# Patient Record
Sex: Female | Born: 1970 | Hispanic: Yes | Smoking: Current every day smoker
Health system: Southern US, Community
[De-identification: ages and names within clinical notes are randomized; demographics above are authoritative.]

## PROBLEM LIST (undated history)

## (undated) DIAGNOSIS — E559 Vitamin D deficiency, unspecified: Secondary | ICD-10-CM

## (undated) DIAGNOSIS — K219 Gastro-esophageal reflux disease without esophagitis: Secondary | ICD-10-CM

## (undated) DIAGNOSIS — M1990 Unspecified osteoarthritis, unspecified site: Secondary | ICD-10-CM

## (undated) DIAGNOSIS — N200 Calculus of kidney: Secondary | ICD-10-CM

## (undated) DIAGNOSIS — M329 Systemic lupus erythematosus, unspecified: Secondary | ICD-10-CM

## (undated) DIAGNOSIS — M549 Dorsalgia, unspecified: Secondary | ICD-10-CM

## (undated) DIAGNOSIS — G4730 Sleep apnea, unspecified: Secondary | ICD-10-CM

## (undated) DIAGNOSIS — Z87442 Personal history of urinary calculi: Secondary | ICD-10-CM

## (undated) DIAGNOSIS — M064 Inflammatory polyarthropathy: Secondary | ICD-10-CM

## (undated) DIAGNOSIS — N809 Endometriosis, unspecified: Secondary | ICD-10-CM

## (undated) DIAGNOSIS — I10 Essential (primary) hypertension: Secondary | ICD-10-CM

## (undated) DIAGNOSIS — L409 Psoriasis, unspecified: Secondary | ICD-10-CM

## (undated) DIAGNOSIS — G4700 Insomnia, unspecified: Secondary | ICD-10-CM

## (undated) HISTORY — PX: BUNIONECTOMY: SHX129

## (undated) HISTORY — PX: TONSILLECTOMY: SUR1361

## (undated) HISTORY — DX: Endometriosis, unspecified: N80.9

## (undated) HISTORY — PX: FOOT SURGERY: SHX648

## (undated) HISTORY — DX: Sleep apnea, unspecified: G47.30

## (undated) HISTORY — DX: Unspecified osteoarthritis, unspecified site: M19.90

## (undated) HISTORY — DX: Essential (primary) hypertension: I10

## (undated) HISTORY — DX: Systemic lupus erythematosus, unspecified: M32.9

## (undated) HISTORY — PX: TUBAL LIGATION: SHX77

---

## 2003-10-11 HISTORY — PX: REDUCTION MAMMAPLASTY: SUR839

## 2013-10-10 HISTORY — PX: REDUCTION MAMMAPLASTY: SUR839

## 2015-03-19 DIAGNOSIS — R7989 Other specified abnormal findings of blood chemistry: Secondary | ICD-10-CM | POA: Insufficient documentation

## 2015-03-19 DIAGNOSIS — E229 Hyperfunction of pituitary gland, unspecified: Secondary | ICD-10-CM

## 2016-02-01 ENCOUNTER — Ambulatory Visit (INDEPENDENT_AMBULATORY_CARE_PROVIDER_SITE_OTHER): Admitting: Urology

## 2016-02-01 ENCOUNTER — Encounter: Payer: Self-pay | Admitting: Urology

## 2016-02-01 VITALS — BP 133/82 | HR 52 | Ht 62.5 in | Wt 183.8 lb

## 2016-02-01 DIAGNOSIS — M549 Dorsalgia, unspecified: Secondary | ICD-10-CM | POA: Diagnosis not present

## 2016-02-01 NOTE — Progress Notes (Signed)
02/01/2016 4:29 PM   Patricia Ray 1971/03/09 [id number]  Referring provider: No referring provider defined for this encounter.  Chief Complaint  Patient presents with  . New Patient (Initial Visit)    lower mid-back pain    HPI: Patient is a 45 year old Caucasian female who presents today with lower mid back pain that is reminiscent of previous stone that she has had in the remote past.  Patient states the back pain began in August 2016. She states the pain was so excruciating that she felt like she had to vomit. The pain is located across the mid back. She did have a stone when she was 7 months pregnant and she states it feels very similar to that incident. She states that she has never passed that stone and that occurred in 1997.  She states the pain lasts about 2-3 minutes. Laying down seems to alleviate the pain.  She states the pain can reach 100 out of 10 on a pain scale and a second.   She is not experiencing urinary symptoms. She specifically denies gross hematuria, flank pain and suprapubic pain. She has not had any recent fevers, chills, nausea or vomiting.  She has not had any recent imaging studies. Her UA on today's exam is negative.     PMH: Past Medical History  Diagnosis Date  . Arthritis   . Hypertension   . Lupus [facility])     Surgical History: Past Surgical History  Procedure Laterality Date  . Cesarean section    . Tubal ligation    . Reduction mammaplasty      Home Medications:    Medication List       This list is accurate as of: 02/01/16  4:29 PM.  Always use your most recent med list.               hydroxychloroquine 200 MG tablet  Commonly known as:  PLAQUENIL  Take by mouth.     hydroxychloroquine 200 MG tablet  Commonly known as:  PLAQUENIL     meloxicam 15 MG tablet  Commonly known as:  MOBIC  TK 1 T PO QD     OTEZLA 10 & 20 & 30 MG Tbpk  Generic drug:  Apremilast  Take by mouth.     OTEZLA 30 MG Tabs  Generic drug:   Apremilast     triamterene-hydrochlorothiazide 37.5-25 MG tablet  Commonly known as:  MAXZIDE-25        Allergies:  Allergies  Allergen Reactions  . Nickel Swelling    Swelling and rash    Family History: Family History  Problem Relation Age of Onset  . Diabetes Mother   . Colon cancer Maternal Grandmother     Social History:  reports that she quit smoking about 9 years ago. She does not have any smokeless tobacco history on file. She reports that she does not drink alcohol or use illicit drugs.  ROS: UROLOGY Frequent Urination?: No Hard to postpone urination?: No Burning/pain with urination?: No Get up at night to urinate?: No Leakage of urine?: No Urine stream starts and stops?: No Trouble starting stream?: No Do you have to strain to urinate?: No Blood in urine?: No Urinary tract infection?: No Sexually transmitted disease?: No Injury to kidneys or bladder?: No Painful intercourse?: No Weak stream?: No Currently pregnant?: No Vaginal bleeding?: No Last menstrual period?: 2 yrs ago  Gastrointestinal Nausea?: Yes Vomiting?: No Indigestion/heartburn?: No Diarrhea?: No Constipation?: Yes  Constitutional Fever: No Night  sweats?: Yes Weight loss?: No Fatigue?: No  Skin Skin rash/lesions?: No Itching?: No  Eyes Blurred vision?: Yes Double vision?: No  Ears/Nose/Throat Sore throat?: No Sinus problems?: No  Hematologic/Lymphatic Swollen glands?: No Easy bruising?: No  Cardiovascular Leg swelling?: No Chest pain?: No  Respiratory Cough?: No Shortness of breath?: No  Endocrine Excessive thirst?: No  Musculoskeletal Back pain?: Yes Joint pain?: Yes  Neurological Headaches?: No Dizziness?: Yes  Psychologic Depression?: No Anxiety?: No  Physical Exam: BP 133/82 mmHg  Pulse 52  Ht 5' 2.5" (1.588 m)  Wt 183 lb 12.8 oz (83.371 kg)  BMI 33.06 kg/m2  LMP 10/02/2014  Constitutional: Well nourished. Alert and oriented, No acute  distress. HEENT: [place] AT, moist mucus membranes. Trachea midline, no masses. Cardiovascular: No clubbing, cyanosis, or edema. Respiratory: Normal respiratory effort, no increased work of breathing. GI: Abdomen is soft, non tender, non distended, no abdominal masses. Liver and spleen not palpable.  No hernias appreciated.  Stool sample for occult testing is not indicated.   GU: No CVA tenderness.  No bladder fullness or masses.   Skin: No rashes, bruises or suspicious lesions. Lymph: No cervical or inguinal adenopathy. Neurologic: Grossly intact, no focal deficits, moving all 4 extremities. Psychiatric: Normal mood and affect.  Laboratory Data:  Urinalysis Results for orders placed or performed in visit on 02/01/16  CULTURE, URINE COMPREHENSIVE  Result Value Ref Range   Urine Culture, Comprehensive Final report    Result 1 Comment   Microscopic Examination  Result Value Ref Range   WBC, UA None seen 0 -  5 /hpf   RBC, UA None seen 0 -  2 /hpf   Epithelial Cells (non renal) 0-10 0 - 10 /hpf   Bacteria, UA None seen None seen/Few  Urinalysis, Complete  Result Value Ref Range   Specific Gravity, UA >1.030 (H) 1.005 - 1.030   pH, UA 6.0 5.0 - 7.5   Color, UA Yellow Yellow   Appearance Ur Clear Clear   Leukocytes, UA Negative Negative   Protein, UA Negative Negative/Trace   Glucose, UA Negative Negative   Ketones, UA Negative Negative   RBC, UA Negative Negative   Bilirubin, UA Negative Negative   Urobilinogen, Ur 0.2 0.2 - 1.0 mg/dL   Nitrite, UA Negative Negative   Microscopic Examination See below:     Assessment & Plan:    1. Back pain, unspecified location:   Patient is experiencing back pain that is reminiscent of her previous stone she had in 1997.  She is unsure if she had passed that stone and is fearful that it is on the move.  Her UA at today's visit was unremarkable.  We will obtain a CT renal stone study for further evaluation and management.  If the pain should  become intractable or if she should experience intractable vomiting, she should seek immediate evaluation in the emergency room. She should also seek immediate evaluation in the emergency room if she should develop fever or chills.    - Urinalysis, Complete - CULTURE, URINE COMPREHENSIVE   Return for CT renal stone report.  These notes generated with voice recognition software. I apologize for typographical errors.  Zara Council, [facility] [address], [place], [place] [address] [phone]

## 2016-02-02 LAB — URINALYSIS, COMPLETE
Bilirubin, UA: NEGATIVE
Glucose, UA: NEGATIVE
Ketones, UA: NEGATIVE
Leukocytes, UA: NEGATIVE
Nitrite, UA: NEGATIVE
Protein, UA: NEGATIVE
RBC, UA: NEGATIVE
Specific Gravity, UA: >1.03 — ABNORMAL HIGH (ref 1.005–1.030)
Urobilinogen, Ur: 0.2 mg/dL (ref 0.2–1.0)
pH, UA: 6 (ref 5.0–7.5)

## 2016-02-02 LAB — MICROSCOPIC EXAMINATION
Bacteria, UA: NONE SEEN
RBC, UA: NONE SEEN /hpf (ref 0–?)
WBC, UA: NONE SEEN /hpf (ref 0–?)

## 2016-02-03 LAB — CULTURE, URINE COMPREHENSIVE

## 2016-02-05 ENCOUNTER — Ambulatory Visit
Admission: RE | Admit: 2016-02-05 | Discharge: 2016-02-05 | Disposition: A | Discharge status: Home / Self Care | Source: Ambulatory Visit | Attending: Urology | Admitting: Urology

## 2016-02-05 DIAGNOSIS — M549 Dorsalgia, unspecified: Secondary | ICD-10-CM | POA: Insufficient documentation

## 2016-02-05 DIAGNOSIS — N200 Calculus of kidney: Secondary | ICD-10-CM | POA: Diagnosis not present

## 2016-02-07 ENCOUNTER — Encounter: Payer: Self-pay | Admitting: Urology

## 2016-02-07 ENCOUNTER — Telehealth: Payer: Self-pay | Admitting: Urology

## 2016-02-07 DIAGNOSIS — M549 Dorsalgia, unspecified: Secondary | ICD-10-CM | POA: Insufficient documentation

## 2016-02-07 NOTE — Telephone Encounter (Signed)
Patient would like her notes sent to Dr. Sheryle Hail and Dr. Orie Rout at [facility] [phone]).

## 2016-02-08 NOTE — Telephone Encounter (Signed)
Done ° ° °Patricia Ray °

## 2016-02-12 NOTE — Telephone Encounter (Signed)
Patient had CT scan and was calling for results.  She want to know if she had to come to her appointment on 5/10 or if we can give her the results over the phone.  Please advise.

## 2016-02-17 ENCOUNTER — Encounter: Payer: Self-pay | Admitting: Urology

## 2016-02-17 ENCOUNTER — Ambulatory Visit (INDEPENDENT_AMBULATORY_CARE_PROVIDER_SITE_OTHER): Admitting: Urology

## 2016-02-17 VITALS — BP 117/83 | HR 108 | Ht 62.5 in | Wt 183.5 lb

## 2016-02-17 DIAGNOSIS — M549 Dorsalgia, unspecified: Secondary | ICD-10-CM | POA: Diagnosis not present

## 2016-02-17 DIAGNOSIS — N200 Calculus of kidney: Secondary | ICD-10-CM

## 2016-02-17 NOTE — Progress Notes (Signed)
11:20 AM   Patricia Ray September 02, 1971 [id number]  Referring provider: Lisabeth Pick, MD [address] [id number] [facility] [place], [place] [address]  Chief Complaint  Patient presents with  . Follow-up    CT results     HPI: Patient is a 45 year old Caucasian female who presents today for her CT renal stone study results.  Background history Patient presented with lower mid back pain that is reminiscent of stone that she has had in the remote past.  Patient stated the back pain began in August 2016. She stated the pain was so excruciating that she felt like she had to vomit. The pain is located across the mid back. She did have a stone when she was 7 months pregnant and she states it feels very similar to that incident. She stated that she has never passed that stone and that occurred in 1997.  She stated the pain lasts about 2-3 minutes. Laying down seems to alleviate the pain.  She stated the pain can reach 100 out of 10 on a pain scale and a second.     She states that her symptoms have not changed since we first saw her 2 weeks ago.  She is not experiencing urinary symptoms. She specifically denies gross hematuria, flank pain and suprapubic pain. She has not had any recent fevers, chills, nausea or vomiting.  Her CT renal stone study noted a 1 mm nonobstructing left renal calculus. No evidence of ureteral calculi, hydronephrosis or other acute findings.  I have personally reviewed the films with the patient.    PMH: Past Medical History  Diagnosis Date  . Arthritis   . Hypertension   . Lupus [facility])     Surgical History: Past Surgical History  Procedure Laterality Date  . Cesarean section    . Tubal ligation    . Reduction mammaplasty      Home Medications:    Medication List       This list is accurate as of: 02/17/16 11:20 AM.  Always use your most recent med list.               hydroxychloroquine 200 MG tablet  Commonly known as:  PLAQUENIL    Take by mouth.     hydroxychloroquine 200 MG tablet  Commonly known as:  PLAQUENIL  Reported on 02/17/2016     Melatonin 1 MG Tabs  Take 1 tablet by mouth as needed.     meloxicam 15 MG tablet  Commonly known as:  MOBIC  TK 1 T PO QD     OTEZLA 10 & 20 & 30 MG Tbpk  Generic drug:  Apremilast  Take by mouth. Reported on 02/17/2016     OTEZLA 30 MG Tabs  Generic drug:  Apremilast     triamterene-hydrochlorothiazide 37.5-25 MG tablet  Commonly known as:  MAXZIDE-25        Allergies:  Allergies  Allergen Reactions  . Nickel Swelling    Swelling and rash    Family History: Family History  Problem Relation Age of Onset  . Diabetes Mother   . Colon cancer Maternal Grandmother     Social History:  reports that she quit smoking about 9 years ago. She does not have any smokeless tobacco history on file. She reports that she does not drink alcohol or use illicit drugs.  ROS: UROLOGY Frequent Urination?: No Hard to postpone urination?: No Burning/pain with urination?: No Get up at night to urinate?: No  Leakage of urine?: No Urine stream starts and stops?: No Trouble starting stream?: No Do you have to strain to urinate?: No Blood in urine?: No Urinary tract infection?: No Sexually transmitted disease?: No Injury to kidneys or bladder?: No Painful intercourse?: No Weak stream?: No Currently pregnant?: No Vaginal bleeding?: No Last menstrual period?: 73yrs   Gastrointestinal Nausea?: No Vomiting?: No Indigestion/heartburn?: No Diarrhea?: No Constipation?: Yes  Constitutional Fever: No Night sweats?: Yes Weight loss?: No Fatigue?: No  Skin Skin rash/lesions?: Yes Itching?: No  Eyes Blurred vision?: Yes Double vision?: No  Ears/Nose/Throat Sore throat?: No Sinus problems?: No  Hematologic/Lymphatic Swollen glands?: No Easy bruising?: No  Cardiovascular Leg swelling?: No Chest pain?: No  Respiratory Cough?: No Shortness of breath?:  No  Endocrine Excessive thirst?: No  Musculoskeletal Back pain?: Yes Joint pain?: Yes  Neurological Headaches?: No Dizziness?: No  Psychologic Depression?: No Anxiety?: No  Physical Exam: BP 117/83 mmHg  Pulse 108  Ht 5' 2.5" (1.588 m)  Wt 183 lb 8 oz (83.235 kg)  BMI 33.01 kg/m2  LMP 10/02/2014  Constitutional: Well nourished. Alert and oriented, No acute distress. HEENT: [place] AT, moist mucus membranes. Trachea midline, no masses. Cardiovascular: No clubbing, cyanosis, or edema. Respiratory: Normal respiratory effort, no increased work of breathing. Skin: No rashes, bruises or suspicious lesions. Lymph: No cervical or inguinal adenopathy. Neurologic: Grossly intact, no focal deficits, moving all 4 extremities. Psychiatric: Normal mood and affect.  Pertinent imaging CLINICAL DATA: Chronic right back and lower quadrant pain for approximately 2 years. Personal history of nephrolithiasis.  EXAM: CT ABDOMEN AND PELVIS WITHOUT CONTRAST  TECHNIQUE: Multidetector CT imaging of the abdomen and pelvis was performed following the standard protocol without IV contrast.  COMPARISON: None.  FINDINGS: Lower chest: No acute findings.  Hepatobiliary: No mass visualized on this un-enhanced exam. Gallbladder is unremarkable.  Pancreas: No mass or inflammatory process identified on this un-enhanced exam.  Spleen: Within normal limits in size.  Adrenals/Urinary Tract: Unremarkable adrenal glands. A 1 mm punctate calculus is seen in the lower pole of the left kidney. No right renal calculi identified. No evidence ureteral calculi or hydronephrosis. No bladder calculi visualized.  Stomach/Bowel: No evidence of obstruction, inflammatory process, or abnormal fluid collections.  Vascular/Lymphatic: No pathologically enlarged lymph nodes. No evidence of abdominal aortic aneurysm. Aortic atherosclerotic plaque noted.  Reproductive: No mass or other significant  abnormality.  Other: None.  Musculoskeletal: No suspicious bone lesions identified.  IMPRESSION: 1 mm nonobstructive left renal calculus. No evidence of ureteral calculi, hydronephrosis, or other acute findings.   Electronically Signed  By: Earle Gell M.D.  On: 02/05/2016 12:15    Assessment & Plan:    1. Renal stone:   Patient was found to have a 1 mm nonobstructive left renal calculus on CT scan.  This is not likely the cause of her mid back pain as it is nonobstructing. It is also located in the lower pole of the kidney.  Because of the renal anatomy, stones in the lower pole have a difficult time leaving the kidney.  It is suggested to have yearly KUB's to monitor the stability of the left renal stone.  2. Back pain:   A GU etiology was not found for the cause of her back pain.  Suggest she follow up with her primary care physician for further evaluation.  Return if symptoms worsen or fail to improve.  These notes generated with voice recognition software. I apologize for typographical errors.  Gazella Anglin, PA-C  [facility] [address], [place], [place] [address] [phone]

## 2016-02-21 ENCOUNTER — Telehealth: Payer: Self-pay | Admitting: Urology

## 2016-02-21 DIAGNOSIS — N200 Calculus of kidney: Secondary | ICD-10-CM | POA: Insufficient documentation

## 2016-02-21 NOTE — Telephone Encounter (Signed)
Patient would like her notes sent to Dr. Sheryle Hail and Dr. Orie Rout at [facility] [phone]).

## 2016-02-23 ENCOUNTER — Telehealth: Payer: Self-pay | Admitting: Urology

## 2016-02-23 NOTE — Telephone Encounter (Signed)
i've already sent this

## 2016-02-23 NOTE — Telephone Encounter (Signed)
The note from

## 2016-02-23 NOTE — Telephone Encounter (Signed)
The note I dictated Sunday night?

## 2016-02-24 NOTE — Telephone Encounter (Signed)
Did you see this? 

## 2016-02-24 NOTE — Telephone Encounter (Signed)
I faxed your last OV today to both doctor's.  Thanks, Sharyn Lull

## 2016-03-02 ENCOUNTER — Telehealth: Payer: Self-pay | Admitting: Urology

## 2016-03-02 NOTE — Telephone Encounter (Signed)
I spoke with Dr. Jory Sims nurse and she has received my office notes and Mrs. Patricia Ray has an upcoming appointment with Dr. Angelene Giovanni in June.  She can keep that appointment or if she feels a need to call and have it moved up, she may do so.

## 2016-07-18 ENCOUNTER — Other Ambulatory Visit: Payer: Self-pay | Admitting: Family Medicine

## 2016-07-18 DIAGNOSIS — Z1231 Encounter for screening mammogram for malignant neoplasm of breast: Secondary | ICD-10-CM

## 2016-08-12 ENCOUNTER — Ambulatory Visit
Admission: RE | Admit: 2016-08-12 | Discharge: 2016-08-12 | Disposition: A | Discharge status: Home / Self Care | Payer: PRIVATE HEALTH INSURANCE | Source: Ambulatory Visit | Attending: Family Medicine | Admitting: Family Medicine

## 2016-08-12 DIAGNOSIS — Z1231 Encounter for screening mammogram for malignant neoplasm of breast: Secondary | ICD-10-CM | POA: Insufficient documentation

## 2016-08-16 ENCOUNTER — Ambulatory Visit
Admission: RE | Admit: 2016-08-16 | Discharge: 2016-08-16 | Disposition: A | Discharge status: Home / Self Care | Payer: Self-pay | Source: Ambulatory Visit | Attending: *Deleted | Admitting: *Deleted

## 2016-08-16 ENCOUNTER — Other Ambulatory Visit: Payer: Self-pay | Admitting: *Deleted

## 2016-08-16 DIAGNOSIS — Z9289 Personal history of other medical treatment: Secondary | ICD-10-CM

## 2016-12-11 ENCOUNTER — Encounter: Payer: Self-pay | Admitting: Emergency Medicine

## 2016-12-11 ENCOUNTER — Emergency Department
Admission: EM | Admit: 2016-12-11 | Discharge: 2016-12-11 | Disposition: A | Discharge status: Home / Self Care | Payer: 59 | Attending: Emergency Medicine | Admitting: Emergency Medicine

## 2016-12-11 DIAGNOSIS — Z79899 Other long term (current) drug therapy: Secondary | ICD-10-CM | POA: Insufficient documentation

## 2016-12-11 DIAGNOSIS — M25561 Pain in right knee: Secondary | ICD-10-CM | POA: Insufficient documentation

## 2016-12-11 DIAGNOSIS — R531 Weakness: Secondary | ICD-10-CM

## 2016-12-11 DIAGNOSIS — Z87891 Personal history of nicotine dependence: Secondary | ICD-10-CM | POA: Diagnosis not present

## 2016-12-11 DIAGNOSIS — I10 Essential (primary) hypertension: Secondary | ICD-10-CM | POA: Insufficient documentation

## 2016-12-11 DIAGNOSIS — M25562 Pain in left knee: Secondary | ICD-10-CM | POA: Diagnosis not present

## 2016-12-11 LAB — BASIC METABOLIC PANEL
Anion gap: 9 (ref 5–15)
BUN: 7 mg/dL (ref 6–20)
CO2: 27 mmol/L (ref 22–32)
Calcium: 9.6 mg/dL (ref 8.9–10.3)
Chloride: 103 mmol/L (ref 101–111)
Creatinine, Ser: 0.58 mg/dL (ref 0.44–1.00)
GFR calc Af Amer: >60 mL/min (ref >60)
GFR calc non Af Amer: >60 mL/min (ref >60)
Glucose, Bld: 101 mg/dL — ABNORMAL HIGH (ref 65–99)
Potassium: 3.4 mmol/L — ABNORMAL LOW (ref 3.5–5.1)
Sodium: 139 mmol/L (ref 135–145)

## 2016-12-11 LAB — URINALYSIS, COMPLETE (UACMP) WITH MICROSCOPIC
Bacteria, UA: NONE SEEN
Bilirubin Urine: NEGATIVE
Glucose, UA: NEGATIVE mg/dL
Hgb urine dipstick: NEGATIVE
Ketones, ur: NEGATIVE mg/dL
Leukocytes, UA: NEGATIVE
Nitrite: NEGATIVE
Protein, ur: NEGATIVE mg/dL
RBC / HPF: NONE SEEN RBC/hpf (ref 0–5)
Specific Gravity, Urine: 1.009 (ref 1.005–1.030)
pH: 6 (ref 5.0–8.0)

## 2016-12-11 LAB — CBC
HCT: 43.6 % (ref 35.0–47.0)
Hemoglobin: 15.4 g/dL (ref 12.0–16.0)
MCH: 29.5 pg (ref 26.0–34.0)
MCHC: 35.2 g/dL (ref 32.0–36.0)
MCV: 83.8 fL (ref 80.0–100.0)
Platelets: 295 10*3/uL (ref 150–440)
RBC: 5.21 MIL/uL — ABNORMAL HIGH (ref 3.80–5.20)
RDW: 14.6 % — ABNORMAL HIGH (ref 11.5–14.5)
WBC: 11.5 10*3/uL — ABNORMAL HIGH (ref 3.6–11.0)

## 2016-12-11 LAB — GLUCOSE, CAPILLARY: Glucose-Capillary: 95 mg/dL (ref 65–99)

## 2016-12-11 MED ORDER — DIAZEPAM 2 MG PO TABS: 2.0000 mg | ORAL_TABLET | Freq: Three times a day (TID) | ORAL | 0 refills | Status: DC | PRN

## 2016-12-11 NOTE — ED Notes (Signed)
ED Provider at bedside. 
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
## 2016-12-11 NOTE — ED Notes (Signed)
Pt alert and oriented X4, active, cooperative, pt in NAD. RR even and unlabored, color WNL.  Pt informed to return if any life threatening symptoms occur.  Pt states that she feels safe to go home.

## 2016-12-11 NOTE — ED Notes (Addendum)
Pt states that she is having a "lupus flare up" for last 2 days. Generalized weakness and bilateral knee pain, burning sensation. PT drove herself here. Able to stand and pivot to stretcher. Pt alert and oriented X4, active, cooperative, pt in NAD. RR even and unlabored, color WNL.   Pt in stretcher awaiting EDP assessment at this time. Denies further needs.

## 2016-12-11 NOTE — ED Notes (Signed)
FIRST NURSE NOTE:  Pt states she is having a Lupus flare up. Weak and dehydrated.
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
## 2017-03-14 ENCOUNTER — Ambulatory Visit (INDEPENDENT_AMBULATORY_CARE_PROVIDER_SITE_OTHER): Payer: 59 | Admitting: Podiatry

## 2017-03-14 ENCOUNTER — Encounter: Payer: Self-pay | Admitting: Podiatry

## 2017-03-14 DIAGNOSIS — B351 Tinea unguium: Secondary | ICD-10-CM

## 2017-03-14 DIAGNOSIS — Z79899 Other long term (current) drug therapy: Secondary | ICD-10-CM

## 2017-03-14 MED ORDER — TERBINAFINE HCL 250 MG PO TABS
250.0000 mg | ORAL_TABLET | Freq: Every day | ORAL | 0 refills | Status: DC
Start: 2017-03-14 — End: 2018-09-03

## 2017-03-15 LAB — HEPATIC FUNCTION PANEL
ALT: 16 IU/L (ref 0–32)
AST: 19 IU/L (ref 0–40)
Albumin: 4.5 g/dL (ref 3.5–5.5)
Alkaline Phosphatase: 58 IU/L (ref 39–117)
Bilirubin Total: 0.6 mg/dL (ref 0.0–1.2)
Bilirubin, Direct: 0.14 mg/dL (ref 0.00–0.40)
Total Protein: 6.7 g/dL (ref 6.0–8.5)

## 2017-03-15 NOTE — Progress Notes (Signed)
   Subjective: Patient presents today for possible treatment and evaluation of fungal nails of bilateral great toes. She reports associated loosening of the nails from the nailbeds. Patient states that the nails have been discolored and thickened for greater than 1 month. Patient presents today for further treatment and evaluation.  Objective: Physical Exam General: The patient is alert and oriented x3 in no acute distress.  Dermatology: Hyperkeratotic, discolored, thickened, onychodystrophy of nails noted bilaterally.  Skin is warm, dry and supple bilateral lower extremities. Negative for open lesions or macerations.  Vascular: Palpable pedal pulses bilaterally. No edema or erythema noted. Capillary refill within normal limits.  Neurological: Epicritic and protective threshold grossly intact bilaterally.   Musculoskeletal Exam: Range of motion within normal limits to all pedal and ankle joints bilateral. Muscle strength 5/5 in all groups bilateral.   Assessment: #1 onychodystrophy bilateral great toenails #2 possible onychomycosis #3 hyperkeratotic great toenails bilateral  Plan of Care:  #1 Patient was evaluated. #2 Orders for liver function tests were ordered today.  #3 Today nail biopsy was taken and sent to pathology for fungal culture. #4 prescription for Lamisil 250 mg #90 given to patient. #5 patient is to return to clinic when necessary.   Edrick Kins, DPM [facility]  Dr. Edrick Kins, [address]                                        [place], [place] [address]                Office [phone]  Fax [phone]

## 2017-03-23 ENCOUNTER — Telehealth: Payer: Self-pay | Admitting: *Deleted

## 2017-03-23 NOTE — Telephone Encounter (Signed)
Pt request blood lab results. I informed pt the results were within normal limits and Dr. Amalia Hailey states pt can begin the Terbinafine, and reappt in 6 months. Pt states understanding and will call for an appt later.

## 2017-10-08 ENCOUNTER — Other Ambulatory Visit: Payer: Self-pay

## 2017-10-08 ENCOUNTER — Encounter: Payer: Self-pay | Admitting: Emergency Medicine

## 2017-10-08 DIAGNOSIS — Z87891 Personal history of nicotine dependence: Secondary | ICD-10-CM | POA: Diagnosis not present

## 2017-10-08 DIAGNOSIS — Z79899 Other long term (current) drug therapy: Secondary | ICD-10-CM | POA: Insufficient documentation

## 2017-10-08 DIAGNOSIS — L509 Urticaria, unspecified: Secondary | ICD-10-CM | POA: Insufficient documentation

## 2017-10-08 DIAGNOSIS — I10 Essential (primary) hypertension: Secondary | ICD-10-CM | POA: Insufficient documentation

## 2017-10-08 NOTE — ED Triage Notes (Signed)
Pt arrived to the ED for complaints of hives x2 days. Pt reports that she has not changed anything on her dayl routine and that benadryl is not helping with the itching. Pt is AOx4 in no apparent distress with notable hives spreading form her back to her legs.

## 2017-10-09 ENCOUNTER — Emergency Department
Admission: EM | Admit: 2017-10-09 | Discharge: 2017-10-09 | Disposition: A | Discharge status: Home / Self Care | Payer: 59 | Attending: Emergency Medicine | Admitting: Emergency Medicine

## 2017-10-09 DIAGNOSIS — L509 Urticaria, unspecified: Secondary | ICD-10-CM

## 2017-10-09 MED ORDER — HYDROXYZINE HCL 25 MG PO TABS
25.0000 mg | ORAL_TABLET | Freq: Once | ORAL | Status: AC
  Administered 2017-10-09: 25 mg via ORAL
  Filled 2017-10-09: qty 1

## 2017-10-09 MED ORDER — PREDNISONE 20 MG PO TABS
60.0000 mg | ORAL_TABLET | Freq: Once | ORAL | Status: AC
  Administered 2017-10-09: 60 mg via ORAL
  Filled 2017-10-09: qty 3

## 2017-10-09 MED ORDER — HYDROXYZINE HCL 25 MG PO TABS: 25.0000 mg | ORAL_TABLET | Freq: Three times a day (TID) | ORAL | 0 refills | Status: DC | PRN

## 2017-10-09 MED ORDER — PREDNISONE 20 MG PO TABS: 60.0000 mg | ORAL_TABLET | Freq: Every day | ORAL | 0 refills | Status: AC

## 2017-10-09 MED ORDER — FAMOTIDINE 20 MG PO TABS
20.0000 mg | ORAL_TABLET | Freq: Once | ORAL | Status: AC
  Administered 2017-10-09: 20 mg via ORAL
  Filled 2017-10-09: qty 1

## 2017-10-09 NOTE — ED Provider Notes (Signed)
[facility] Emergency Department Provider Note   First MD Initiated Contact with Patient 10/09/17 [phone]     (approximate)  I have reviewed the triage vital signs and the nursing notes.   HISTORY  Chief Complaint Urticaria   HPI Patricia Ray is a 46 y.o. female with below list of chronic medical conditions presents to the emergency department with 2-day history of generalized pruritus and hives.  Patient denies any difficulty breathing no difficulty swallowing.  Patient denies any facial swelling.  Patient with no history of allergic reactions in the past unsure of what may have caused her pruritus.  Patient states that she has been taking Benadryl at home without relief last Benadryl dose was at 7:00 PM tonight.   Past Medical History:  Diagnosis Date  . Arthritis   . Hypertension   . Lupus     Patient Active Problem List   Diagnosis Date Noted  . Renal stone 02/21/2016  . Notalgia 02/07/2016  . Increased prolactin level ([place]) 03/19/2015    Past Surgical History:  Procedure Laterality Date  . BREAST CYST ASPIRATION Right 2008  . CESAREAN SECTION    . REDUCTION MAMMAPLASTY  2015  . TUBAL LIGATION      Prior to Admission medications   Medication Sig Start Date End Date Taking? Authorizing Provider  ALPRAZolam Duanne Moron) 0.5 MG tablet Take 0.5 mg by mouth at bedtime as needed for anxiety.    [provider]  Apremilast (OTEZLA) 10 & 20 & 30 MG TBPK Take by mouth. Reported on 02/17/2016    [provider]  hydroxychloroquine (PLAQUENIL) 200 MG tablet Take by mouth. 07/04/14   [provider]  hydroxychloroquine (PLAQUENIL) 200 MG tablet Reported on 02/17/2016 01/04/16   [provider]  Melatonin 1 MG TABS Take 1 tablet by mouth as needed.    [provider]  meloxicam (MOBIC) 15 MG tablet TK 1 T PO QD 01/12/16   [provider]  OTEZLA 30 MG TABS  12/11/15   [provider]  terbinafine  (LAMISIL) 250 MG tablet Take 1 tablet (250 mg total) by mouth daily. 03/14/17   Edrick Kins, DPM  triamterene-hydrochlorothiazide [facility]) 37.5-25 MG tablet  12/27/15   [provider]  Vitamin D, Ergocalciferol, (DRISDOL) 50000 units CAPS capsule Take 50,000 Units by mouth every 7 (seven) days.    [provider]    Allergies Simvastatin; Methotrexate; and Nickel  Family History  Problem Relation Age of Onset  . Diabetes Mother   . Colon cancer Maternal Grandmother     Social History Social History   Tobacco Use  . Smoking status: Former Smoker    Last attempt to quit: 02/01/2007    Years since quitting: 10.6  . Smokeless tobacco: Never Used  Substance Use Topics  . Alcohol use: No    Alcohol/week: 0.0 oz  . Drug use: No    Review of Systems Constitutional: No fever/chills Eyes: No visual changes. ENT: No sore throat. Cardiovascular: Denies chest pain. Respiratory: Denies shortness of breath. Gastrointestinal: No abdominal pain.  No nausea, no vomiting.  No diarrhea.  No constipation. Genitourinary: Negative for dysuria. Musculoskeletal: Negative for neck pain.  Negative for back pain. Integumentary: Positive for pruritus and hives Neurological: Negative for headaches, focal weakness or numbness.  ____________________________________________   PHYSICAL EXAM:  VITAL SIGNS: ED Triage Vitals [10/08/17 2218]  Enc Vitals Group     BP 112/78     Pulse Rate (!) 120  Resp 18     Temp 98.4 F (36.9 C)     Temp Source Oral     SpO2 96 %     Weight 71.7 kg (158 lb)     Height 1.575 m (5\' 2" )     Head Circumference      Peak Flow      Pain Score 0     Pain Loc      Pain Edu?      Excl. in [place]?     Constitutional: Alert and oriented. Well appearing and in no acute distress. Eyes: Conjunctivae are normal.  Mouth/Throat: Mucous membranes are moist.  Oropharynx non-erythematous. Neck: No stridor.   Cardiovascular: Normal rate, regular  rhythm. Good peripheral circulation. Grossly normal heart sounds. Respiratory: Normal respiratory effort.  No retractions. Lungs CTAB. Gastrointestinal: Soft and nontender. No distention.  Musculoskeletal: No lower extremity tenderness nor edema. No gross deformities of extremities. Neurologic:  Normal speech and language. No gross focal neurologic deficits are appreciated.  Skin: Bilateral upper extremity and chest and back excoriations hives noted on the arm Psychiatric: Mood and affect are normal. Speech and behavior are normal.  ____________________________________________     Procedures   ____________________________________________   INITIAL IMPRESSION / ASSESSMENT AND PLAN / ED COURSE  As part of my medical decision making, I reviewed the following data within the electronic MEDICAL RECORD [id number] year old female presented with above-stated history and physical exam consistent with urticaria of unknown etiology.  Patient will be prescribed Atarax and prednisone for home. ____________________________________________  FINAL CLINICAL IMPRESSION(S) / ED DIAGNOSES  Final diagnoses:  Urticaria     MEDICATIONS GIVEN DURING THIS VISIT:  Medications  hydrOXYzine (ATARAX/VISTARIL) tablet 25 mg (25 mg Oral Given 10/09/17 0114)  predniSONE (DELTASONE) tablet 60 mg (60 mg Oral Given 10/09/17 0114)  famotidine (PEPCID) tablet 20 mg (20 mg Oral Given 10/09/17 0114)     ED Discharge Orders    None       Note:  This document was prepared using Dragon voice recognition software and may include unintentional dictation errors.    Gregor Hams, MD 10/09/17 [phone]

## 2018-02-28 ENCOUNTER — Other Ambulatory Visit: Payer: Self-pay | Admitting: Family Medicine

## 2018-02-28 DIAGNOSIS — Z1231 Encounter for screening mammogram for malignant neoplasm of breast: Secondary | ICD-10-CM

## 2018-03-21 ENCOUNTER — Ambulatory Visit
Admission: RE | Admit: 2018-03-21 | Discharge: 2018-03-21 | Disposition: A | Discharge status: Home / Self Care | Payer: 59 | Source: Ambulatory Visit | Attending: Family Medicine | Admitting: Family Medicine

## 2018-03-21 DIAGNOSIS — Z1231 Encounter for screening mammogram for malignant neoplasm of breast: Secondary | ICD-10-CM | POA: Insufficient documentation

## 2018-03-22 ENCOUNTER — Other Ambulatory Visit: Payer: Self-pay | Admitting: Family Medicine

## 2018-03-22 DIAGNOSIS — R928 Other abnormal and inconclusive findings on diagnostic imaging of breast: Secondary | ICD-10-CM

## 2018-03-22 DIAGNOSIS — N6310 Unspecified lump in the right breast, unspecified quadrant: Secondary | ICD-10-CM

## 2018-03-29 ENCOUNTER — Ambulatory Visit
Admission: RE | Admit: 2018-03-29 | Discharge: 2018-03-29 | Disposition: A | Discharge status: Home / Self Care | Payer: 59 | Source: Ambulatory Visit | Attending: Family Medicine | Admitting: Family Medicine

## 2018-03-29 DIAGNOSIS — R928 Other abnormal and inconclusive findings on diagnostic imaging of breast: Secondary | ICD-10-CM

## 2018-03-29 DIAGNOSIS — N6310 Unspecified lump in the right breast, unspecified quadrant: Secondary | ICD-10-CM

## 2018-04-02 ENCOUNTER — Other Ambulatory Visit: Payer: Self-pay | Admitting: Family Medicine

## 2018-04-02 DIAGNOSIS — R928 Other abnormal and inconclusive findings on diagnostic imaging of breast: Secondary | ICD-10-CM

## 2018-04-02 DIAGNOSIS — N6310 Unspecified lump in the right breast, unspecified quadrant: Secondary | ICD-10-CM

## 2018-04-04 ENCOUNTER — Ambulatory Visit
Admission: RE | Admit: 2018-04-04 | Discharge: 2018-04-04 | Disposition: A | Discharge status: Home / Self Care | Payer: 59 | Source: Ambulatory Visit | Attending: Family Medicine | Admitting: Family Medicine

## 2018-04-04 DIAGNOSIS — R928 Other abnormal and inconclusive findings on diagnostic imaging of breast: Secondary | ICD-10-CM | POA: Diagnosis present

## 2018-04-04 DIAGNOSIS — N6310 Unspecified lump in the right breast, unspecified quadrant: Secondary | ICD-10-CM

## 2018-04-04 HISTORY — PX: BREAST BIOPSY: SHX20

## 2018-04-05 LAB — SURGICAL PATHOLOGY

## 2018-04-11 DIAGNOSIS — M064 Inflammatory polyarthropathy: Secondary | ICD-10-CM | POA: Insufficient documentation

## 2018-04-11 DIAGNOSIS — L930 Discoid lupus erythematosus: Secondary | ICD-10-CM | POA: Insufficient documentation

## 2018-05-08 NOTE — Progress Notes (Signed)
Office Visit Note  Patient: Patricia Ray             Date of Birth: Dec 28, 1970           MRN: [id number]             PCP: Sharyne Peach, MD Referring: Sharyne Peach, MD Visit Date: 05/10/2018 Occupation: Underwriter specialist  Subjective:  Medication management   History of Present Illness: Patricia Ray is a 47 y.o. female seen in consultation per request of her PCP.  According to patient her symptoms are started in 1998 with rash on her face.  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  She was initially tried on topical agents and then Plaquenil was added a year later.  She has been tolerating Plaquenil well and has been getting yearly eye examinations.  She states in 1999 she developed increased joint pain which was in her hips and her knee joints.  She dealt with the pain and no diagnosis was established.  In 2014 she developed psoriasis in her scalp and was diagnosed with psoriatic arthritis.  She states she was placed on methotrexate but had severe reaction between methotrexate and simvastatin she was hospitalized.  After that she was placed on Otezla and psoriasis resolved on Otezla.  She continues to have some joint pain which she describes in her elbows, in her bilateral hands, bilateral hip joints and knee joints.  She is noticed occasional swelling in her hands.  Her right hand gets numb at times.  Patient reports that she has intermittent flares of arthritis during that time she has a lot of discomfort.  She is requesting to get FMLA papers filled.  Activities of Daily Living:  Patient reports morning stiffness for 5 hours.   Patient Denies nocturnal pain.  Difficulty dressing/grooming: Denies Difficulty climbing stairs: Reports Difficulty getting out of chair: Reports Difficulty using hands for taps, buttons, cutlery, and/or writing: Reports  Review of Systems  Constitutional: Positive for fatigue. Negative for activity  change, night sweats, weight gain and weight loss.  HENT: Negative for mouth sores, trouble swallowing, trouble swallowing, mouth dryness and nose dryness.   Eyes: Positive for dryness. Negative for pain, redness, itching and visual disturbance.  Respiratory: Negative for cough, shortness of breath and difficulty breathing.   Cardiovascular: Negative for chest pain, palpitations, hypertension, irregular heartbeat and swelling in legs/feet.  Gastrointestinal: Positive for diarrhea and heartburn. Negative for blood in stool and constipation.  Endocrine: Negative for heat intolerance, excessive thirst and increased urination.  Genitourinary: Negative for difficulty urinating and vaginal dryness.  Musculoskeletal: Positive for arthralgias, joint pain, joint swelling and morning stiffness. Negative for myalgias, muscle weakness, muscle tenderness and myalgias.  Skin: Positive for rash. Negative for color change, hair loss, skin tightness, ulcers and sensitivity to sunlight.  Allergic/Immunologic: Negative for susceptible to infections.  Neurological: Positive for numbness. Negative for dizziness, light-headedness, memory loss, night sweats and weakness.  Hematological: Negative for bruising/bleeding tendency and swollen glands.  Psychiatric/Behavioral: Positive for sleep disturbance. Negative for depressed mood. The patient is not nervous/anxious.     PMFS History:  Patient Active Problem List   Diagnosis Date Noted  . Psoriatic arthritis ([place]) 05/10/2018  . Psoriasis of scalp 05/10/2018  . 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 High risk medication use 05/10/2018  . Essential hypertension 05/10/2018  . Other insomnia 05/10/2018  . Elevated prolactin level ([place]) 05/10/2018  . Vitamin D insufficiency 05/10/2018  .  Renal stone 02/21/2016  . Notalgia 02/07/2016  . Increased prolactin level ([place]) 03/19/2015    Past Medical History:  Diagnosis Date  . Arthritis   . Endometriosis   . Hypertension   .  Lupus ([place])     Family History  Problem Relation Age of Onset  . Hypercalcemia Mother   . High Cholesterol Mother   . Colon cancer Maternal Grandmother   . Diabetes Father   . Bipolar disorder Sister   . Depression Sister   . Breast cancer Neg Hx    Past Surgical History:  Procedure Laterality Date  . BREAST BIOPSY Right 04/04/2018   pending path  . BUNIONECTOMY    . CESAREAN SECTION    . FOOT SURGERY    . REDUCTION MAMMAPLASTY  2015  . TONSILLECTOMY    . TUBAL LIGATION     Social History   Social History Narrative  . Not on file    Objective: Vital Signs: BP 121/80 (BP Location: Right Arm, Patient Position: Sitting, Cuff Size: Normal)   Pulse (!) 101   Resp 16   Ht 5' 2.5" (1.588 m)   Wt 179 lb (81.2 kg)   LMP 10/02/2014   BMI 32.22 kg/m    Physical Exam  Constitutional: She is oriented to person, place, and time. She appears well-developed and well-nourished.  HENT:  Head: Normocephalic and atraumatic.  Eyes: Conjunctivae and EOM are normal.  Neck: Normal range of motion.  Cardiovascular: Normal rate, regular rhythm, normal heart sounds and intact distal pulses.  Pulmonary/Chest: Effort normal and breath sounds normal.  Abdominal: Soft. Bowel sounds are normal.  Lymphadenopathy:    She has no cervical adenopathy.  Neurological: She is alert and oriented to person, place, and time.  Skin: Skin is warm and dry. Capillary refill takes less than 2 seconds.  Psychiatric: She has a normal mood and affect. Her behavior is normal.  Nursing note and vitals reviewed.    Musculoskeletal Exam: Spine thoracic lumbar spine good range of motion.  Shoulder joints elbow joints wrist joint MCPs PIPs DIPs with good range of motion.  Hip joints knee joints ankles MTPs PIPs DIPs were in good range of motion with no synovitis.  CDAI Exam: CDAI Homunculus Exam:   Joint Counts:  CDAI Tender Joint count: 0 CDAI Swollen Joint count: 0  Global Assessments:  Patient Global  Assessment: 5 Provider Global Assessment: 1  CDAI Calculated Score: 6   Investigation: No additional findings.  Imaging: Xr Hip Unilat W Or W/o Pelvis 2-3 Views Right  Result Date: 05/10/2018 No hip joint narrowing or chondrocalcinosis was noted.   SI joint was normal.  Xr Foot 2 Views Left  Result Date: 05/10/2018 Minimal first MTP narrowing PIP DIP narrowing was noted.  No erosive changes were noted.  No intertarsal joint space narrowing was noted.  Calcaneal spur was noted. Impression: These findings are consistent with osteoarthritis of the foot.  Xr Foot 2 Views Right  Result Date: 05/10/2018 Minimal first MTP narrowing PIP DIP narrowing was noted.  No erosive changes were noted.  No intertarsal joint space narrowing was noted.  Calcaneal spur was noted. Impression: These findings are consistent with osteoarthritis of the foot.  Xr Hand 2 View Left  Result Date: 05/10/2018 No MCP PIP or DIP narrowing was noted.  No intercarpal radiocarpal joint space narrowing was noted.  No erosive changes were noted.  No juxta articular osteopenia was noted. Impression: Unremarkable x-ray of the hand.  Xr Hand 2  View Right  Result Date: 05/10/2018 No MCP PIP or DIP narrowing was noted.  No intercarpal radiocarpal joint space narrowing was noted.  No erosive changes were noted.  No juxta articular osteopenia was noted. Impression: Unremarkable x-ray of the hand.  Xr Knee 3 View Left  Result Date: 05/10/2018 Mild medial compartment narrowing was noted.  Moderate patellofemoral narrowing was noted.  No chondrocalcinosis was noted. 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  Xr Knee 3 View Right  Result Date: 05/10/2018 Mild medial compartment narrowing was noted.  Moderate patellofemoral narrowing was noted.  No chondrocalcinosis was noted. 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   Recent Labs: Lab Results    Component Value Date   WBC 11.5 (H) 12/11/2016   HGB 15.4 12/11/2016   PLT 295 12/11/2016   NA 139 12/11/2016   K 3.4 (L) 12/11/2016   CL 103 12/11/2016   CO2 27 12/11/2016   GLUCOSE 101 (H) 12/11/2016   BUN 7 12/11/2016   CREATININE 0.58 12/11/2016   BILITOT 0.6 03/14/2017   ALKPHOS 58 03/14/2017   AST 19 03/14/2017   ALT 16 03/14/2017   PROT 6.7 03/14/2017   ALBUMIN 4.5 03/14/2017   CALCIUM 9.6 12/11/2016   GFRAA >60 12/11/2016    Speciality Comments: No specialty comments available.  Procedures:  No procedures performed Allergies: Simvastatin; Methotrexate; and Nickel   Assessment / Plan:     Visit Diagnoses: Psoriatic arthritis ([place]) -she gives long-standing history of psoriatic arthritis.  She was under care of her rheumatologist in [place].  I do not see any synovitis on examination.  No synovial thickening was noted.  Plan: Sedimentation rate  Psoriasis of scalp - Clobetasol 0.05% shampoo -she has no active psoriasis today.  Discoid lupus - ANA 1:160, dsDNA-, RNP +(labs from hx, no recent autoimmune labs found) patient reports that one time she was diagnosed with systemic lupus.  I will obtain following labs.  Plan: ANA, Anti-scleroderma antibody, RNP Antibody, Anti-Smith antibody, Sjogrens syndrome-A extractable nuclear antibody, Sjogrens syndrome-B extractable nuclear antibody, Anti-DNA antibody, double-stranded, C3 and C4, Beta-2 glycoprotein antibodies, Cardiolipin antibodies, IgG, IgM, IgA, Lupus Anticoagulant Eval w/Reflex  High risk medication use - PLQ 200 mg BID and Otezla 30 mg BIDallergy to MTX (hospitalized for 1 wk after taking MTX and simvastatin) - Plan: CBC with Differential/Platelet, COMPLETE METABOLIC PANEL WITH GFR, Urinalysis, Routine w reflex microscopic, Glucose 6 phosphate dehydrogenase, Hepatitis B core antibody, IgM, Hepatitis B surface antigen, Hepatitis C antibody, Serum protein electrophoresis with reflex, QuantiFERON-TB Gold Plus, IgG, IgA,  IgM  Patient was counseled on the purpose, proper use, and adverse effects of hydroxychloroquine including nausea/diarrhea, skin rash, headaches, and sun sensitivity.  Discussed importance of annual eye exams while on hydroxychloroquine to monitor to ocular toxicity and discussed importance of frequent laboratory monitoring.  Provided patient with eye exam form for baseline ophthalmologic exam.  Provided patient with educational materials on hydroxychloroquine and answered all questions.  Patient consented to hydroxychloroquine.  Will upload consent in the media tab.    Counseled patient that [place] N[place] is a PDE 4 inhibitor that works to treat psoriasis and the joint pain and tenderness of psoriatic arthritis.  Counseled patient on purpose, proper use, and adverse effects of Otezla.  Reviewed the most common adverse effects of weight loss, depression, nausea/diarrhea/vomiting, headaches, and nasal congestion.  Provided patient with medication education material and answered all questions.  Patient consented to [place].  Pain in both hands -no synovitis was noted.  Patient gives history of intermittent swelling in her hands.  Plan: XR Hand 2 View Right, XR Hand 2 View Left, x-ray of bilateral hands were unremarkable.  Rheumatoid factor, Cyclic citrul peptide antibody, IgG  Pain in right hip -painful range of motion of her right hip joint.  Plan: XR HIP UNILAT W OR W/O PELVIS 2-3 VIEWS RIGHT.  The x-ray of hip joint was within normal limits.  Chronic pain of both knees -she complains of chronic pain in her bilateral knee joints with intermittent swelling.  No synovitis was noted today.  Plan: XR KNEE 3 VIEW RIGHT, XR KNEE 3 VIEW LEFT.  The x-ray shows bilateral mild osteoarthritis and moderate chondromalacia patella.  Pain in both feet -she complains of pain in her bilateral feet with intermittent flares.  No synovitis was noted today.  Plan: XR Foot 2 Views Right, XR Foot 2 Views Left which showed mild  osteoarthritic changes.  Essential hypertension  Other insomnia  Elevated prolactin level ([facility])  Vitamin D insufficiency  Renal stone  Other fatigue - Plan: CK, TSH   Orders: Orders Placed This Encounter  Procedures  . XR Hand 2 View Right  . XR Hand 2 View Left  . XR KNEE 3 VIEW RIGHT  . XR KNEE 3 VIEW LEFT  . XR Foot 2 Views Right  . XR Foot 2 Views Left  . XR HIP UNILAT W OR W/O PELVIS 2-3 VIEWS RIGHT  . CBC with Differential/Platelet  . COMPLETE METABOLIC PANEL WITH GFR  . Urinalysis, Routine w reflex microscopic  . CK  . TSH  . Sedimentation rate  . Rheumatoid factor  . Cyclic citrul peptide antibody, IgG  . ANA  . Anti-scleroderma antibody  . RNP Antibody  . Anti-Smith antibody  . Sjogrens syndrome-A extractable nuclear antibody  . Sjogrens syndrome-B extractable nuclear antibody  . Anti-DNA antibody, double-stranded  . C3 and C4  . Beta-2 glycoprotein antibodies  . Cardiolipin antibodies, IgG, IgM, IgA  . Lupus Anticoagulant Eval w/Reflex  . Glucose 6 phosphate dehydrogenase  . Hepatitis B core antibody, IgM  . Hepatitis B surface antigen  . Hepatitis C antibody  . Serum protein electrophoresis with reflex  . QuantiFERON-TB Gold Plus  . IgG, IgA, IgM   No orders of the defined types were placed in this encounter.   Face-to-face time spent with patient was 50 minutes. Greater than 50% of time was spent in counseling and coordination of care.  Follow-Up Instructions: Return in about 3 months (around 08/10/2018) for Psoriatic arthritis,DLE.   Bo Merino, MD  Note - This record has been created using Editor, commissioning.  Chart creation errors have been sought, but may not always  have been located. Such creation errors do not reflect on  the standard of medical care.

## 2018-05-10 ENCOUNTER — Ambulatory Visit (INDEPENDENT_AMBULATORY_CARE_PROVIDER_SITE_OTHER): Payer: Self-pay

## 2018-05-10 ENCOUNTER — Ambulatory Visit (INDEPENDENT_AMBULATORY_CARE_PROVIDER_SITE_OTHER): Payer: 59 | Admitting: Rheumatology

## 2018-05-10 ENCOUNTER — Encounter: Payer: Self-pay | Admitting: Rheumatology

## 2018-05-10 ENCOUNTER — Ambulatory Visit (INDEPENDENT_AMBULATORY_CARE_PROVIDER_SITE_OTHER): Payer: 59

## 2018-05-10 ENCOUNTER — Encounter (INDEPENDENT_AMBULATORY_CARE_PROVIDER_SITE_OTHER): Payer: Self-pay

## 2018-05-10 VITALS — BP 121/80 | HR 101 | Resp 16 | Ht 62.5 in | Wt 179.0 lb

## 2018-05-10 DIAGNOSIS — M25562 Pain in left knee: Secondary | ICD-10-CM

## 2018-05-10 DIAGNOSIS — M25551 Pain in right hip: Secondary | ICD-10-CM

## 2018-05-10 DIAGNOSIS — M79671 Pain in right foot: Secondary | ICD-10-CM | POA: Diagnosis not present

## 2018-05-10 DIAGNOSIS — L409 Psoriasis, unspecified: Secondary | ICD-10-CM | POA: Diagnosis not present

## 2018-05-10 DIAGNOSIS — M25561 Pain in right knee: Secondary | ICD-10-CM | POA: Diagnosis not present

## 2018-05-10 DIAGNOSIS — N200 Calculus of kidney: Secondary | ICD-10-CM

## 2018-05-10 DIAGNOSIS — M79672 Pain in left foot: Secondary | ICD-10-CM

## 2018-05-10 DIAGNOSIS — R5383 Other fatigue: Secondary | ICD-10-CM

## 2018-05-10 DIAGNOSIS — E559 Vitamin D deficiency, unspecified: Secondary | ICD-10-CM | POA: Insufficient documentation

## 2018-05-10 DIAGNOSIS — G8929 Other chronic pain: Secondary | ICD-10-CM

## 2018-05-10 DIAGNOSIS — E229 Hyperfunction of pituitary gland, unspecified: Secondary | ICD-10-CM

## 2018-05-10 DIAGNOSIS — M79641 Pain in right hand: Secondary | ICD-10-CM | POA: Diagnosis not present

## 2018-05-10 DIAGNOSIS — M79642 Pain in left hand: Secondary | ICD-10-CM

## 2018-05-10 DIAGNOSIS — Z79899 Other long term (current) drug therapy: Secondary | ICD-10-CM | POA: Diagnosis not present

## 2018-05-10 DIAGNOSIS — L4050 Arthropathic psoriasis, unspecified: Secondary | ICD-10-CM | POA: Insufficient documentation

## 2018-05-10 DIAGNOSIS — L930 Discoid lupus erythematosus: Secondary | ICD-10-CM

## 2018-05-10 DIAGNOSIS — G4709 Other insomnia: Secondary | ICD-10-CM | POA: Insufficient documentation

## 2018-05-10 DIAGNOSIS — I10 Essential (primary) hypertension: Secondary | ICD-10-CM | POA: Insufficient documentation

## 2018-05-10 DIAGNOSIS — R7989 Other specified abnormal findings of blood chemistry: Secondary | ICD-10-CM | POA: Insufficient documentation

## 2018-05-10 NOTE — Patient Instructions (Signed)
Apremilast oral tablets What is this medicine? APREMILAST (a PRE mil ast) is used to treat plaque psoriasis and psoriatic arthritis. This medicine may be used for other purposes; ask your health care provider or pharmacist if you have questions. COMMON BRAND NAME(S): R[place] Nail What should I tell my health care provider before I take this medicine? They need to know if you have any of these conditions: -dehydration -kidney disease -mental illness -an unusual or allergic reaction to apremilast, other medicines, foods, dyes, or preservatives -pregnant or trying to get pregnant -breast-feeding How should I use this medicine? Take this medicine by mouth with a glass of water. Follow the directions on the prescription label. Do not cut, crush or chew this medicine. You can take it with or without food. If it upsets your stomach, take it with food. Take your medicine at regular intervals. Do not take it more often than directed. Do not stop taking except on your doctor's advice. Talk to your pediatrician regarding the use of this medicine in children. Special care may be needed. Overdosage: If you think you have taken too much of this medicine contact a poison control center or emergency room at once. NOTE: This medicine is only for you. Do not share this medicine with others. What if I miss a dose? If you miss a dose, take it as soon as you can. If it is almost time for your next dose, take only that dose. Do not take double or extra doses. What may interact with this medicine? This medicine may interact with the following medications: -certain medicines for seizures like carbamazepine, phenobarbital, phenytoin -rifampin This list may not describe all possible interactions. Give your health care provider a list of all the medicines, herbs, non-prescription drugs, or dietary supplements you use. Also tell them if you smoke, drink alcohol, or use illegal drugs. Some items may interact with your  medicine. What should I watch for while using this medicine? Tell your doctor or healthcare professional if your symptoms do not start to get better or if they get worse. Patients and their families should watch out for new or worsening depression or thoughts of suicide. Also watch out for sudden changes in feelings such as feeling anxious, agitated, panicky, irritable, hostile, aggressive, impulsive, severely restless, overly excited and hyperactive, or not being able to sleep. If this happens, call your health care professional. Check with your doctor or health care professional if you get an attack of severe diarrhea, nausea and vomiting, or if you sweat a lot. The loss of too much body fluid can make it dangerous for you to take this medicine. What side effects may I notice from receiving this medicine? Side effects that you should report to your doctor or health care professional as soon as possible: -depressed mood -weight loss Side effects that usually do not require medical attention (report to your doctor or health care professional if they continue or are bothersome): -diarrhea -headache -nausea, vomiting This list may not describe all possible side effects. Call your doctor for medical advice about side effects. You may report side effects to FDA at 1-800-FDA-1088. Where should I keep my medicine? Keep out of the reach of children. Store below 30 degrees C (86 degrees F). Throw away any unused medicine after the expiration date. NOTE: This sheet is a summary. It may not cover all possible information. If you have questions about this medicine, talk to your doctor, pharmacist, or health care provider.  2018 Elsevier/Gold Standard (2016-04-13 10:55:44)  Hydroxychloroquine tablets What is this medicine? HYDROXYCHLOROQUINE (hye drox ee KLOR oh kwin) is used to treat rheumatoid arthritis and systemic lupus erythematosus. It is also used to treat malaria. This medicine may be used for  other purposes; ask your health care provider or pharmacist if you have questions. COMMON BRAND NAME(S): Plaquenil, Quineprox What should I tell my health care provider before I take this medicine? They need to know if you have any of these conditions: -diabetes -eye disease, vision problems -G6PD deficiency -history of blood diseases -history of irregular heartbeat -if you often drink alcohol -kidney disease -liver disease -porphyria -psoriasis -seizures -an unusual or allergic reaction to chloroquine, hydroxychloroquine, other medicines, foods, dyes, or preservatives -pregnant or trying to get pregnant -breast-feeding How should I use this medicine? Take this medicine by mouth with a glass of water. Follow the directions on the prescription label. Avoid taking antacids within 4 hours of taking this medicine. It is best to separate these medicines by at least 4 hours. Do not cut, crush or chew this medicine. You can take it with or without food. If it upsets your stomach, take it with food. Take your medicine at regular intervals. Do not take your medicine more often than directed. Take all of your medicine as directed even if you think you are better. Do not skip doses or stop your medicine early. Talk to your pediatrician regarding the use of this medicine in children. While this drug may be prescribed for selected conditions, precautions do apply. Overdosage: If you think you have taken too much of this medicine contact a poison control center or emergency room at once. NOTE: This medicine is only for you. Do not share this medicine with others. What if I miss a dose? If you miss a dose, take it as soon as you can. If it is almost time for your next dose, take only that dose. Do not take double or extra doses. What may interact with this medicine? Do not take this medicine with any of the following medications: -cisapride -dofetilide -dronedarone -live virus  vaccines -penicillamine -pimozide -thioridazine -ziprasidone This medicine may also interact with the following medications: -ampicillin -antacids -cimetidine -cyclosporine -digoxin -medicines for diabetes, like insulin, glipizide, glyburide -medicines for seizures like carbamazepine, phenobarbital, phenytoin -mefloquine -methotrexate -other medicines that prolong the QT interval (cause an abnormal heart rhythm) -praziquantel This list may not describe all possible interactions. Give your health care provider a list of all the medicines, herbs, non-prescription drugs, or dietary supplements you use. Also tell them if you smoke, drink alcohol, or use illegal drugs. Some items may interact with your medicine. What should I watch for while using this medicine? Tell your doctor or healthcare professional if your symptoms do not start to get better or if they get worse. Avoid taking antacids within 4 hours of taking this medicine. It is best to separate these medicines by at least 4 hours. Tell your doctor or health care professional right away if you have any change in your eyesight. Your vision and blood may be tested before and during use of this medicine. This medicine can make you more sensitive to the sun. Keep out of the sun. If you cannot avoid being in the sun, wear protective clothing and use sunscreen. Do not use sun lamps or tanning beds/booths. What side effects may I notice from receiving this medicine? Side effects that you should report to your doctor or health care professional as soon as possible: -allergic reactions like skin  rash, itching or hives, swelling of the face, lips, or tongue -changes in vision -decreased hearing or ringing of the ears -redness, blistering, peeling or loosening of the skin, including inside the mouth -seizures -sensitivity to light -signs and symptoms of a dangerous change in heartbeat or heart rhythm like chest pain; dizziness; fast or  irregular heartbeat; palpitations; feeling faint or lightheaded, falls; breathing problems -signs and symptoms of liver injury like dark yellow or brown urine; general ill feeling or flu-like symptoms; light-colored stools; loss of appetite; nausea; right upper belly pain; unusually weak or tired; yellowing of the eyes or skin -signs and symptoms of low blood sugar such as feeling anxious; confusion; dizziness; increased hunger; unusually weak or tired; sweating; shakiness; cold; irritable; headache; blurred vision; fast heartbeat; loss of consciousness -uncontrollable head, mouth, neck, arm, or leg movements Side effects that usually do not require medical attention (report to your doctor or health care professional if they continue or are bothersome): -anxious -diarrhea -dizziness -hair loss -headache -irritable -loss of appetite -nausea, vomiting -stomach pain This list may not describe all possible side effects. Call your doctor for medical advice about side effects. You may report side effects to FDA at 1-800-FDA-1088. Where should I keep my medicine? Keep out of the reach of children. In children, this medicine can cause overdose with small doses. Store at room temperature between 15 and 30 degrees C (59 and 86 degrees F). Protect from moisture and light. Throw away any unused medicine after the expiration date. NOTE: This sheet is a summary. It may not cover all possible information. If you have questions about this medicine, talk to your doctor, pharmacist, or health care provider.  2018 Elsevier/Gold Standard (2016-05-11 14:16:15)

## 2018-05-14 NOTE — Progress Notes (Signed)
Will discuss at the fu visit

## 2018-05-15 LAB — URINALYSIS, ROUTINE W REFLEX MICROSCOPIC
Bilirubin Urine: NEGATIVE
Glucose, UA: NEGATIVE
Hgb urine dipstick: NEGATIVE
Ketones, ur: NEGATIVE
Leukocytes, UA: NEGATIVE
Nitrite: NEGATIVE
Specific Gravity, Urine: 1.03 (ref 1.001–1.03)
pH: 5.5 (ref 5.0–8.0)

## 2018-05-15 LAB — SJOGRENS SYNDROME-A EXTRACTABLE NUCLEAR ANTIBODY: SSA (Ro) (ENA) Antibody, IgG: >8 AI — AB

## 2018-05-15 LAB — CBC WITH DIFFERENTIAL/PLATELET
Basophils Absolute: 47 cells/uL (ref 0–200)
Basophils Relative: 0.5 %
Eosinophils Absolute: 1278 cells/uL — ABNORMAL HIGH (ref 15–500)
Eosinophils Relative: 13.6 %
HCT: 48.8 % — ABNORMAL HIGH (ref 35.0–45.0)
Hemoglobin: 16.3 g/dL — ABNORMAL HIGH (ref 11.7–15.5)
Lymphs Abs: 2867 cells/uL (ref 850–3900)
MCH: 28.8 pg (ref 27.0–33.0)
MCHC: 33.4 g/dL (ref 32.0–36.0)
MCV: 86.4 fL (ref 80.0–100.0)
MPV: 9.4 fL (ref 7.5–12.5)
Monocytes Relative: 7.4 %
Neutro Abs: 4512 cells/uL (ref 1500–7800)
Neutrophils Relative %: 48 %
Platelets: 292 10*3/uL (ref 140–400)
RBC: 5.65 10*6/uL — ABNORMAL HIGH (ref 3.80–5.10)
RDW: 14.1 % (ref 11.0–15.0)
Total Lymphocyte: 30.5 %
WBC mixed population: 696 cells/uL (ref 200–950)
WBC: 9.4 10*3/uL (ref 3.8–10.8)

## 2018-05-15 LAB — CYCLIC CITRUL PEPTIDE ANTIBODY, IGG: Cyclic Citrullin Peptide Ab: <16 UNITS

## 2018-05-15 LAB — PROTEIN ELECTROPHORESIS, SERUM, WITH REFLEX
Albumin ELP: 4.5 g/dL (ref 3.8–4.8)
Alpha 1: 0.3 g/dL (ref 0.2–0.3)
Alpha 2: 0.9 g/dL (ref 0.5–0.9)
Beta 2: 0.5 g/dL (ref 0.2–0.5)
Beta Globulin: 0.5 g/dL (ref 0.4–0.6)
Gamma Globulin: 0.9 g/dL (ref 0.8–1.7)
Total Protein: 7.7 g/dL (ref 6.1–8.1)

## 2018-05-15 LAB — RHEUMATOID FACTOR: Rhuematoid fact SerPl-aCnc: <14 IU/mL (ref <14)

## 2018-05-15 LAB — CARDIOLIPIN ANTIBODIES, IGG, IGM, IGA
Anticardiolipin IgA: <11 [APL'U]
Anticardiolipin IgG: <14 [GPL'U]
Anticardiolipin IgM: <12 [MPL'U]

## 2018-05-15 LAB — C3 AND C4
C3 Complement: 196 mg/dL — ABNORMAL HIGH (ref 83–193)
C4 Complement: 50 mg/dL (ref 15–57)

## 2018-05-15 LAB — ANTI-SMITH ANTIBODY: ENA SM Ab Ser-aCnc: <1 AI

## 2018-05-15 LAB — ANTI-NUCLEAR AB-TITER (ANA TITER): ANA Titer 1: 1:160 {titer} — ABNORMAL HIGH

## 2018-05-15 LAB — QUANTIFERON-TB GOLD PLUS
Mitogen-NIL: >10 IU/mL
NIL: 0.05 IU/mL
QuantiFERON-TB Gold Plus: NEGATIVE
TB1-NIL: 0.05 IU/mL
TB2-NIL: 0.02 IU/mL

## 2018-05-15 LAB — MICROSCOPIC MESSAGE

## 2018-05-15 LAB — COMPLETE METABOLIC PANEL WITH GFR
AG Ratio: 1.7 (calc) (ref 1.0–2.5)
ALT: 25 U/L (ref 6–29)
AST: 19 U/L (ref 10–35)
Albumin: 4.8 g/dL (ref 3.6–5.1)
Alkaline phosphatase (APISO): 64 U/L (ref 33–115)
BUN: 15 mg/dL (ref 7–25)
CO2: 23 mmol/L (ref 20–32)
Calcium: 10.2 mg/dL (ref 8.6–10.2)
Chloride: 102 mmol/L (ref 98–110)
Creat: 0.76 mg/dL (ref 0.50–1.10)
GFR, Est African American: 108 mL/min/{1.73_m2} (ref >=60)
GFR, Est Non African American: 93 mL/min/{1.73_m2} (ref >=60)
Globulin: 2.9 g/dL (calc) (ref 1.9–3.7)
Glucose, Bld: 76 mg/dL (ref 65–99)
Potassium: 3.8 mmol/L (ref 3.5–5.3)
Sodium: 141 mmol/L (ref 135–146)
Total Bilirubin: 0.5 mg/dL (ref 0.2–1.2)
Total Protein: 7.7 g/dL (ref 6.1–8.1)

## 2018-05-15 LAB — HEPATITIS B CORE ANTIBODY, IGM: Hep B C IgM: NONREACTIVE

## 2018-05-15 LAB — SJOGRENS SYNDROME-B EXTRACTABLE NUCLEAR ANTIBODY: SSB (La) (ENA) Antibody, IgG: <1 AI

## 2018-05-15 LAB — ANTI-DNA ANTIBODY, DOUBLE-STRANDED: ds DNA Ab: <1 IU/mL

## 2018-05-15 LAB — LUPUS ANTICOAGULANT EVAL W/ REFLEX
PTT-LA Screen: 35 s (ref <=40)
dRVVT: 43 s (ref <=45)

## 2018-05-15 LAB — HEPATITIS B SURFACE ANTIGEN: Hepatitis B Surface Ag: NONREACTIVE

## 2018-05-15 LAB — BETA-2 GLYCOPROTEIN ANTIBODIES
Beta-2 Glyco 1 IgA: <9 SAU (ref <=20)
Beta-2 Glyco 1 IgM: <9 SMU (ref <=20)
Beta-2 Glyco I IgG: <9 SGU (ref <=20)

## 2018-05-15 LAB — ANA: Anti Nuclear Antibody(ANA): POSITIVE — AB

## 2018-05-15 LAB — ANTI-SCLERODERMA ANTIBODY: Scleroderma (Scl-70) (ENA) Antibody, IgG: <1 AI

## 2018-05-15 LAB — RNP ANTIBODY: Ribonucleic Protein(ENA) Antibody, IgG: 2.1 AI — AB

## 2018-05-15 LAB — GLUCOSE 6 PHOSPHATE DEHYDROGENASE: G-6PDH: 17 U/g Hgb (ref 7.0–20.5)

## 2018-05-15 LAB — IGG, IGA, IGM
IgG (Immunoglobin G), Serum: 1028 mg/dL (ref 600–1640)
IgM, Serum: 65 mg/dL (ref 50–300)
Immunoglobulin A: 278 mg/dL (ref 47–310)

## 2018-05-15 LAB — HEPATITIS C ANTIBODY
Hepatitis C Ab: NONREACTIVE
SIGNAL TO CUT-OFF: 0.07 (ref <1.00)

## 2018-05-15 LAB — TSH: TSH: 1.57 mIU/L

## 2018-05-15 LAB — SEDIMENTATION RATE: Sed Rate: 19 mm/h (ref 0–20)

## 2018-05-15 LAB — CK: Total CK: 80 U/L (ref 29–143)

## 2018-06-12 ENCOUNTER — Ambulatory Visit: Payer: 59 | Admitting: Rheumatology

## 2018-07-02 ENCOUNTER — Encounter: Payer: Self-pay | Admitting: Rheumatology

## 2018-07-09 ENCOUNTER — Encounter: Payer: Self-pay | Admitting: Rheumatology

## 2018-07-09 MED ORDER — CLOBETASOL PROPIONATE 0.05 % EX SHAM: MEDICATED_SHAMPOO | CUTANEOUS | 1 refills | Status: DC

## 2018-07-09 NOTE — Telephone Encounter (Signed)
Last Visit: 05/10/18 Next Visit: 08/07/18  Okay to refill per Dr. Estanislado Pandy

## 2018-07-31 NOTE — Progress Notes (Deleted)
Office Visit Note  Patient: Patricia Ray             Date of Birth: 06/05/1971           MRN: [id number]             PCP: Sharyne Peach, MD Referring: Sharyne Peach, MD Visit Date: 08/07/2018 Occupation: @GUAROCC @  Subjective:  No chief complaint on file.   History of Present Illness: Patricia Ray is a 47 y.o. female ***   Activities of Daily Living:  Patient reports morning stiffness for *** {minute/hour:19697}.   Patient {ACTIONS;DENIES/REPORTS:21021675::"Denies"} nocturnal pain.  Difficulty dressing/grooming: {ACTIONS;DENIES/REPORTS:21021675::"Denies"} Difficulty climbing stairs: {ACTIONS;DENIES/REPORTS:21021675::"Denies"} Difficulty getting out of chair: {ACTIONS;DENIES/REPORTS:21021675::"Denies"} Difficulty using hands for taps, buttons, cutlery, and/or writing: {ACTIONS;DENIES/REPORTS:21021675::"Denies"}  No Rheumatology ROS completed.   PMFS History:  Patient Active Problem List   Diagnosis Date Noted  . Psoriatic arthritis ([place]) 05/10/2018  . Psoriasis of scalp 05/10/2018  . 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 High risk medication use 05/10/2018  . Essential hypertension 05/10/2018  . Other insomnia 05/10/2018  . 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 Vitamin D insufficiency 05/10/2018  . Renal stone 02/21/2016  . Notalgia 02/07/2016  . Increased prolactin level ([place]) 03/19/2015    Past Medical History:  Diagnosis Date  . Arthritis   . Endometriosis   . Hypertension   . Lupus ([place])     Family History  Problem Relation Age of Onset  . Hypercalcemia Mother   . High Cholesterol Mother   . Colon cancer Maternal Grandmother   . Diabetes Father   . Bipolar disorder Sister   . Depression Sister   . Breast cancer Neg Hx    Past Surgical History:  Procedure Laterality Date  . BREAST BIOPSY Right 04/04/2018   pending path  . BUNIONECTOMY    . CESAREAN SECTION    . FOOT SURGERY    . REDUCTION MAMMAPLASTY  2015  . TONSILLECTOMY    . TUBAL  LIGATION     Social History   Social History Narrative  . Not on file    Objective: Vital Signs: LMP 10/02/2014    Physical Exam   Musculoskeletal Exam: ***  CDAI Exam: CDAI Score: Not documented Patient Global Assessment: Not documented; Provider Global Assessment: Not documented Swollen: Not documented; Tender: Not documented Joint Exam   Not documented   There is currently no information documented on the homunculus. Go to the Rheumatology activity and complete the homunculus joint exam.  Investigation: No additional findings.  Imaging: No results found.  Recent Labs: Lab Results  Component Value Date   WBC 9.4 05/10/2018   HGB 16.3 (H) 05/10/2018   PLT 292 05/10/2018   NA 141 05/10/2018   K 3.8 05/10/2018   CL 102 05/10/2018   CO2 23 05/10/2018   GLUCOSE 76 05/10/2018   BUN 15 05/10/2018   CREATININE 0.76 05/10/2018   BILITOT 0.5 05/10/2018   ALKPHOS 58 03/14/2017   AST 19 05/10/2018   ALT 25 05/10/2018   PROT 7.7 05/10/2018   PROT 7.7 05/10/2018   ALBUMIN 4.5 03/14/2017   CALCIUM 10.2 05/10/2018   GFRAA 108 05/10/2018   QFTBGOLDPLUS NEGATIVE 05/10/2018  UA negative,SPEP negative, immunoglobulins normal, hepatitis B-, hepatitis C negative, G6PD normal TB Gold negative, TSH normal, CK normal ANA 1: 160 speckled, dsDNA negative, SSA positive, SSB negative, RNP positive, SCL 70-, Smith negative, C3 and C4 normal, beta-2 GP 1-, anticardiolipin negative, lupus anticoagulant negative RF negative, anti-CCP negative  Speciality  Comments: PLQ eye exam: 07/17/2018 normal. [facility]. Follow up in 6 months.  Procedures:  No procedures performed Allergies: Simvastatin; Methotrexate; and Nickel   Assessment / Plan:     Visit Diagnoses: No diagnosis found.   Orders: No orders of the defined types were placed in this encounter.  No orders of the defined types were placed in this encounter.   Face-to-face time spent with patient was *** minutes. Greater  than 50% of time was spent in counseling and coordination of care.  Follow-Up Instructions: No follow-ups on file.   Bo Merino, MD  Note - This record has been created using Editor, commissioning.  Chart creation errors have been sought, but may not always  have been located. Such creation errors do not reflect on  the standard of medical care.

## 2018-08-07 ENCOUNTER — Ambulatory Visit: Payer: 59 | Admitting: Rheumatology

## 2018-08-23 DIAGNOSIS — M170 Bilateral primary osteoarthritis of knee: Secondary | ICD-10-CM | POA: Insufficient documentation

## 2018-08-23 NOTE — Progress Notes (Signed)
Office Visit Note  Patient: Patricia Ray             Date of Birth: 1971-03-10           MRN: [id number]             PCP: Sharyne Peach, MD Referring: Sharyne Peach, MD Visit Date: 09/03/2018 Occupation: @GUAROCC @  Subjective:  Pain in both hands.   History of Present Illness: Patricia Ray is a 47 y.o. female 3 of discoid lupus, psoriasis, psoriatic arthritis and osteoarthritis  She comes today after her initial visit.  She states that she was treated for urinary tract infection by her GYN and for bronchitis at the urgent care and then followed up by her PCP.  She has been experiencing some swelling in her hands.  She feels her rings are tight.  Psoriasis is better after using the shampoo.  He has not had any rash from discoid lupus recently joint pain is tolerable.  Activities of Daily Living:  Patient reports morning stiffness for 3 minutes.   Patient Denies nocturnal pain.  Difficulty dressing/grooming: Denies Difficulty climbing stairs: Denies Difficulty getting out of chair: Denies Difficulty using hands for taps, buttons, cutlery, and/or writing: Denies  Review of Systems  Constitutional: Positive for fatigue.  HENT: Negative for mouth sores, mouth dryness and nose dryness.   Eyes: Positive for dryness. Negative for pain and visual disturbance.  Respiratory: Negative for cough, hemoptysis, shortness of breath and difficulty breathing.   Cardiovascular: Negative for chest pain, palpitations, hypertension and swelling in legs/feet.  Gastrointestinal: Negative for blood in stool, constipation and diarrhea.  Endocrine: Negative for increased urination.  Genitourinary: Negative for difficulty urinating and painful urination.  Musculoskeletal: Positive for arthralgias, joint pain, joint swelling and morning stiffness. Negative for myalgias, muscle weakness, muscle tenderness and myalgias.  Skin: Negative for color change, pallor, rash, hair loss, nodules/bumps, skin  tightness, ulcers and sensitivity to sunlight.  Allergic/Immunologic: Negative for susceptible to infections.  Neurological: Positive for dizziness and numbness. Negative for headaches and weakness.  Hematological: Negative for bruising/bleeding tendency and swollen glands.  Psychiatric/Behavioral: Positive for sleep disturbance. Negative for depressed mood. The patient is not nervous/anxious.     PMFS History:  Patient Active Problem List   Diagnosis Date Noted  . Primary osteoarthritis of both knees 08/23/2018  . Psoriatic arthritis ([place]) 05/10/2018  . Psoriasis of scalp 05/10/2018  . 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 High risk medication use 05/10/2018  . Essential hypertension 05/10/2018  . Other insomnia 05/10/2018  . 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 Vitamin D insufficiency 05/10/2018  . Renal stone 02/21/2016  . Notalgia 02/07/2016  . 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 Arthritis   . Endometriosis   . Hypertension   . Lupus ([place])     Family History  Problem Relation Age of Onset  . Hypercalcemia Mother   . High Cholesterol Mother   . Colon cancer Maternal Grandmother   . Diabetes Father   . Bipolar disorder Sister   . Depression Sister   . Breast cancer Neg Hx    Past Surgical History:  Procedure Laterality Date  . BREAST BIOPSY Right 04/04/2018   pending path  . BUNIONECTOMY    . CESAREAN SECTION    . FOOT SURGERY    . REDUCTION MAMMAPLASTY  2015  . TONSILLECTOMY    . TUBAL LIGATION     Social History  Social History Narrative  . Not on file    Objective: Vital Signs: BP 124/87 (BP Location: Left Arm, Patient Position: Sitting, Cuff Size: Normal)   Pulse (!) 119   Resp 14   Ht 5' 2.5" (1.588 m)   Wt 187 lb 12.8 oz (85.2 kg)   LMP 10/02/2014   BMI 33.80 kg/m    Physical Exam  Constitutional: She is oriented to person, place, and time. She appears well-developed and well-nourished.    HENT:  Head: Normocephalic and atraumatic.  Eyes: Conjunctivae and EOM are normal.  Neck: Normal range of motion.  Cardiovascular: Normal rate, regular rhythm, normal heart sounds and intact distal pulses.  Pulmonary/Chest: Effort normal and breath sounds normal.  Abdominal: Soft. Bowel sounds are normal.  Lymphadenopathy:    She has no cervical adenopathy.  Neurological: She is alert and oriented to person, place, and time.  Skin: Skin is warm and dry. Capillary refill takes less than 2 seconds.  Psychiatric: She has a normal mood and affect. Her behavior is normal.  Nursing note and vitals reviewed.    Musculoskeletal Exam: C-spine thoracic lumbar spine good range of motion.  Shoulder joints elbow joints wrist joints MCPs PIPs DIPs been good range of motion with no synovitis.  Hip joints knee joints ankles MTPs PIPs were in good range of motion with no synovitis.  She is some crepitus in her knee joints.  CDAI Exam: CDAI Score: Not documented Patient Global Assessment: Not documented; Provider Global Assessment: Not documented Swollen: Not documented; Tender: Not documented Joint Exam   Not documented   There is currently no information documented on the homunculus. Go to the Rheumatology activity and complete the homunculus joint exam.  Investigation: No additional findings.  Imaging: No results found.  Recent Labs: Lab Results  Component Value Date   WBC 9.4 05/10/2018   HGB 16.3 (H) 05/10/2018   PLT 292 05/10/2018   NA 141 05/10/2018   K 3.8 05/10/2018   CL 102 05/10/2018   CO2 23 05/10/2018   GLUCOSE 76 05/10/2018   BUN 15 05/10/2018   CREATININE 0.76 05/10/2018   BILITOT 0.5 05/10/2018   ALKPHOS 58 03/14/2017   AST 19 05/10/2018   ALT 25 05/10/2018   PROT 7.7 05/10/2018   PROT 7.7 05/10/2018   ALBUMIN 4.5 03/14/2017   CALCIUM 10.2 05/10/2018   GFRAA 108 05/10/2018   QFTBGOLDPLUS NEGATIVE 05/10/2018  UA  , SPEP negative, immunoglobulins normal, TB Gold  negative, appetite is B-, hepatitis C negative, G6PD normal, TSH normal, CK 80, UA showed trace protein and few bacteria ANA 1: 160 speckled, RNP positive, SSA positive, SSB negative, Smith negative, SCL 70-, dsDNA negative, C3-C4 normal, anticardiolipin negative, beta-2 GP 1-, lupus anticoagulant negative RF negative, anti-CCP negative  Speciality Comments: PLQ eye exam: 07/17/2018 normal. [facility]. Follow up in 6 months.  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  She has no clinical features of mixed connective tissue at this point.  Although she has positive RNP.  Positive Ro antibody could be associated with arrhythmias.  Have advised her to get an EKG through her PCP.  Prescription refill for Plaquenil and [place] Nail was given today.  Side effects were discussed at length.  Psoriasis - Patient had psoriasis on her scalp.  She has been  using clobetasol shampoo which has helped her psoriasis.  Psoriatic arthropathy ([place]) - Diagnosed by rheumatologist in [place].  She has been on [place].  I do not see any synovitis on examination today.  Patient gives history of intermittent swelling.  High risk medication use -Current regimen includes Plaquenil 200 mg twice daily and Otezla 30 mg twice daily prescribed by her dermatologist.  Prior therapy includes methotrexate but had sever reaction. Last PLQ eye exam normal on 07/17/18.  Most recent CBC/CMP within normal limits except elevated RBC, HCT, hemoglobin on 05/10/18. allergy to MTX (hospitalized for 1 wk after taking MTX and simvastatin)  Primary osteoarthritis of both knees - Osteoarthritis bilateral knees mild, bilateral moderate chondromalacia patella.  Joint protection muscle strengthening was discussed.  Essential hypertension-her blood  pressure is mildly elevated today.  Renal stone  Vitamin D insufficiency  Elevated prolactin level ([place])   Orders: Orders Placed This Encounter  Procedures  . CBC with Differential/Platelet  . COMPLETE METABOLIC PANEL WITH GFR   Meds ordered this encounter  Medications  . hydroxychloroquine (PLAQUENIL) 200 MG tablet    Sig: Take 1 tablet (200 mg total) by mouth 2 (two) times daily.    Dispense:  180 tablet    Refill:  0  . OTEZLA 30 MG TABS    Sig: Take 30 mg by mouth 2 (two) times daily.    Dispense:  180 tablet    Refill:  0     Follow-Up Instructions: Return in about 5 months (around 02/02/2019) for "Lupus, psoriatic arthritis, osteoarthritis.   Bo Merino, MD  Note - This record has been created using Editor, commissioning.  Chart creation errors have been sought, but may not always  have been located. Such creation errors do not reflect on  the standard of medical care.

## 2018-09-03 ENCOUNTER — Ambulatory Visit (INDEPENDENT_AMBULATORY_CARE_PROVIDER_SITE_OTHER): Payer: 59 | Admitting: Rheumatology

## 2018-09-03 ENCOUNTER — Encounter: Payer: Self-pay | Admitting: Rheumatology

## 2018-09-03 VITALS — BP 124/87 | HR 119 | Resp 14 | Ht 62.5 in | Wt 187.8 lb

## 2018-09-03 DIAGNOSIS — E559 Vitamin D deficiency, unspecified: Secondary | ICD-10-CM

## 2018-09-03 DIAGNOSIS — L4050 Arthropathic psoriasis, unspecified: Secondary | ICD-10-CM | POA: Diagnosis not present

## 2018-09-03 DIAGNOSIS — I10 Essential (primary) hypertension: Secondary | ICD-10-CM

## 2018-09-03 DIAGNOSIS — R7989 Other specified abnormal findings of blood chemistry: Secondary | ICD-10-CM

## 2018-09-03 DIAGNOSIS — M170 Bilateral primary osteoarthritis of knee: Secondary | ICD-10-CM

## 2018-09-03 DIAGNOSIS — L409 Psoriasis, unspecified: Secondary | ICD-10-CM | POA: Diagnosis not present

## 2018-09-03 DIAGNOSIS — N200 Calculus of kidney: Secondary | ICD-10-CM

## 2018-09-03 DIAGNOSIS — L930 Discoid lupus erythematosus: Secondary | ICD-10-CM | POA: Diagnosis not present

## 2018-09-03 DIAGNOSIS — Z79899 Other long term (current) drug therapy: Secondary | ICD-10-CM | POA: Diagnosis not present

## 2018-09-03 DIAGNOSIS — E229 Hyperfunction of pituitary gland, unspecified: Secondary | ICD-10-CM

## 2018-09-03 MED ORDER — OTEZLA 30 MG PO TABS: 30.0000 mg | ORAL_TABLET | Freq: Two times a day (BID) | ORAL | 0 refills | Status: DC

## 2018-09-03 MED ORDER — HYDROXYCHLOROQUINE SULFATE 200 MG PO TABS: 200.0000 mg | ORAL_TABLET | Freq: Two times a day (BID) | ORAL | 0 refills | Status: DC

## 2018-09-03 NOTE — Patient Instructions (Signed)
Standing Labs We placed an order today for your standing lab work.    Please come back and get your standing labs in January and then 5 months.  We have open lab Monday through Friday from 8:30-11:30 AM and 1:30-4:00 PM at the office of Dr. Bo Merino.   You may experience shorter wait times on Monday and Friday afternoons. The office is located at [address] [place], [place], [place] [address] No appointment is necessary.   Labs are drawn by [facility].  You may receive a bill from [facility] for your lab work. If you have any questions regarding directions or hours of operation,  please call [phone].   Just as a reminder please drink plenty of water prior to coming for your lab work. Thanks!  Vaccines You are taking a medication(s) that can suppress your immune system.  The following immunizations are recommended: . Flu annually . Pneumonia (Pneumovax 23)  Please check with your PCP to make sure you are up to date.

## 2018-09-03 NOTE — Progress Notes (Signed)
Pharmacy Note  Subjective:  Patient presents today to the [facility] to see Dr. Estanislado Pandy.  Patient was seen by the pharmacist for counseling on Otezla.  She was prescribed Otezla and Plaquenil by another rheumatologist but has transferred care.  States she has been on Plaquenil since her twenties. Prior therapy includes methotrexate but she had severe reaction She has obtained her flu shot for this year.  Objective: CMP     Component Value Date/Time   NA 141 05/10/2018 0912   K 3.8 05/10/2018 0912   CL 102 05/10/2018 0912   CO2 23 05/10/2018 0912   GLUCOSE 76 05/10/2018 0912   BUN 15 05/10/2018 0912   CREATININE 0.76 05/10/2018 0912   CALCIUM 10.2 05/10/2018 0912   PROT 7.7 05/10/2018 0912   PROT 7.7 05/10/2018 0912   PROT 6.7 03/14/2017 0941   ALBUMIN 4.5 03/14/2017 0941   AST 19 05/10/2018 0912   ALT 25 05/10/2018 0912   ALKPHOS 58 03/14/2017 0941   BILITOT 0.5 05/10/2018 0912   BILITOT 0.6 03/14/2017 0941   GFRNONAA 93 05/10/2018 0912   GFRAA 108 05/10/2018 0912    Assessment/Plan:  Counseled patient that [place] is a PDE 4 inhibitor that works to treat psoriasis and the joint pain and tenderness of psoriatic arthritis.  Counseled patient on purpose, proper use, and adverse effects of Otezla.  Reviewed the most common adverse effects of weight loss, depression, nausea/diarrhea/vomiting, headaches, and nasal congestion.  Provided patient with medication education material and answered all questions.  Patient consented to [place].  Will upload consent in the media tab.     Patient was counseled on the purpose, proper use, and adverse effects of hydroxychloroquine including nausea/diarrhea, skin rash, headaches, and sun sensitivity.  Discussed importance of annual eye exams while on hydroxychloroquine to monitor to ocular toxicity and discussed importance of frequent laboratory monitoring.  Provided patient with eye exam form for baseline ophthalmologic exam.   Provided patient with educational materials on hydroxychloroquine and answered all questions.  Patient consented to hydroxychloroquine.  Will upload consent in the media tab.    Last PLQ eye exam normal on 07/17/18.  Most recent CBC/CMP within normal limits except elevated RBC, HCT, hemoglobin on 05/10/18.  Instructed patient to return in January for labs and then every 5 months. Standing orders placed.  Recommended Pneumovax 23 vaccine.  Patient [facility].  All questions encouraged and answered.  Instructed patient to call with any further questions.  Mariella Saa, PharmD, [facility] Rheumatology Clinical Pharmacist  09/03/2018 10:20 AM

## 2018-09-04 ENCOUNTER — Telehealth: Payer: Self-pay | Admitting: *Deleted

## 2018-09-04 NOTE — Telephone Encounter (Signed)
Patient advised her paperwork for tinted window waiver application form is ready for pick up.

## 2018-09-14 ENCOUNTER — Encounter: Payer: Self-pay | Admitting: Rheumatology

## 2018-09-17 ENCOUNTER — Telehealth: Payer: Self-pay | Admitting: Rheumatology

## 2018-09-17 NOTE — Telephone Encounter (Signed)
Patient called stating she had an EKG on Friday with her PCP Dr. Iona Beard.  Patient states Dr. Iona Beard faxed the results to Dr. Estanislado Pandy for her review.  Patient is requesting a return call.

## 2018-09-18 NOTE — Telephone Encounter (Signed)
Patient advised we have not received the EKG from PCP's office. Patient is going to contact PCP's office to have them send it over.

## 2018-09-20 ENCOUNTER — Telehealth: Payer: Self-pay | Admitting: *Deleted

## 2018-09-20 NOTE — Telephone Encounter (Signed)
Patient advised we have received her results for her EKG and her results show she has a normal sinus rhythm. Patient verbalized understanding.

## 2018-10-22 ENCOUNTER — Encounter: Payer: Self-pay | Admitting: Rheumatology

## 2018-10-22 ENCOUNTER — Other Ambulatory Visit: Payer: Self-pay

## 2018-10-22 DIAGNOSIS — Z79899 Other long term (current) drug therapy: Secondary | ICD-10-CM

## 2018-10-23 LAB — CBC WITH DIFFERENTIAL/PLATELET
Absolute Monocytes: 713 cells/uL (ref 200–950)
Basophils Absolute: 49 cells/uL (ref 0–200)
Basophils Relative: 0.6 %
Eosinophils Absolute: 81 cells/uL (ref 15–500)
Eosinophils Relative: 1 %
HCT: 44.8 % (ref 35.0–45.0)
Hemoglobin: 14.9 g/dL (ref 11.7–15.5)
Lymphs Abs: 3021 cells/uL (ref 850–3900)
MCH: 29.3 pg (ref 27.0–33.0)
MCHC: 33.3 g/dL (ref 32.0–36.0)
MCV: 88 fL (ref 80.0–100.0)
MPV: 10.7 fL (ref 7.5–12.5)
Monocytes Relative: 8.8 %
Neutro Abs: 4236 cells/uL (ref 1500–7800)
Neutrophils Relative %: 52.3 %
Platelets: 307 10*3/uL (ref 140–400)
RBC: 5.09 10*6/uL (ref 3.80–5.10)
RDW: 14.1 % (ref 11.0–15.0)
Total Lymphocyte: 37.3 %
WBC: 8.1 10*3/uL (ref 3.8–10.8)

## 2018-10-23 LAB — COMPLETE METABOLIC PANEL WITH GFR
AG Ratio: 1.7 (calc) (ref 1.0–2.5)
ALT: 13 U/L (ref 6–29)
AST: 16 U/L (ref 10–35)
Albumin: 4.1 g/dL (ref 3.6–5.1)
Alkaline phosphatase (APISO): 51 U/L (ref 33–115)
BUN: 11 mg/dL (ref 7–25)
CO2: 26 mmol/L (ref 20–32)
Calcium: 9.3 mg/dL (ref 8.6–10.2)
Chloride: 108 mmol/L (ref 98–110)
Creat: 0.76 mg/dL (ref 0.50–1.10)
GFR, Est African American: 108 mL/min/{1.73_m2} (ref >=60)
GFR, Est Non African American: 93 mL/min/{1.73_m2} (ref >=60)
Globulin: 2.4 g/dL (calc) (ref 1.9–3.7)
Glucose, Bld: 85 mg/dL (ref 65–99)
Potassium: 4.4 mmol/L (ref 3.5–5.3)
Sodium: 142 mmol/L (ref 135–146)
Total Bilirubin: 0.5 mg/dL (ref 0.2–1.2)
Total Protein: 6.5 g/dL (ref 6.1–8.1)

## 2018-10-23 NOTE — Telephone Encounter (Signed)
Spoke with patient to advise of lab results. Also advised patient she may come to the office to pick up prescription for copper gloves.

## 2018-10-23 NOTE — Telephone Encounter (Signed)
How to order them.  If she can find out we can write her prescription

## 2018-10-25 ENCOUNTER — Encounter: Payer: Self-pay | Admitting: Rheumatology

## 2018-10-30 DIAGNOSIS — G4733 Obstructive sleep apnea (adult) (pediatric): Secondary | ICD-10-CM | POA: Insufficient documentation

## 2018-11-23 ENCOUNTER — Ambulatory Visit (INDEPENDENT_AMBULATORY_CARE_PROVIDER_SITE_OTHER): Payer: 59

## 2018-11-23 ENCOUNTER — Ambulatory Visit (INDEPENDENT_AMBULATORY_CARE_PROVIDER_SITE_OTHER): Payer: 59 | Admitting: Podiatry

## 2018-11-23 ENCOUNTER — Encounter: Payer: Self-pay | Admitting: Podiatry

## 2018-11-23 DIAGNOSIS — L600 Ingrowing nail: Secondary | ICD-10-CM | POA: Diagnosis not present

## 2018-11-23 DIAGNOSIS — M722 Plantar fascial fibromatosis: Secondary | ICD-10-CM | POA: Diagnosis not present

## 2018-11-23 MED ORDER — GENTAMICIN SULFATE 0.1 % EX CREA: 1.0000 "application " | TOPICAL_CREAM | Freq: Two times a day (BID) | CUTANEOUS | 1 refills | Status: DC

## 2018-11-26 NOTE — Progress Notes (Signed)
   Subjective: Patient presents today for evaluation of pain to the lateral border of the right hallux that began a few weeks ago. Patient is concerned for possible ingrown nail. Applying pressure to the toe increases the pain. She has not done anything for treatment.  She also reports pain to the plantar aspect of the right heel that began about two months ago. Walking increases the pain. She has not had any treatment. Patient presents today for further treatment and evaluation.  Past Medical History:  Diagnosis Date  . Arthritis   . Endometriosis   . Hypertension   . Lupus ([place])     Objective:  General: Well developed, nourished, in no acute distress, alert and oriented x3   Dermatology: Skin is warm, dry and supple bilateral. Lateral border of the right hallux appears to be erythematous with evidence of an ingrowing nail. Pain on palpation noted to the border of the nail fold. There are no open sores, lesions. Hyperkeratotic, discolored, thickened, onychodystrophy of nails 1-5 noted bilaterally.   Vascular: Dorsalis Pedis artery and Posterior Tibial artery pedal pulses palpable. No lower extremity edema noted.   Neruologic: Grossly intact via light touch bilateral.  Musculoskeletal: Pain with palpation noted to the right heel along the plantar fascia. Muscular strength within normal limits in all groups bilateral. Normal range of motion noted to all pedal and ankle joints.   Radiographic Exam:  Normal osseous mineralization. Joint spaces preserved. No fracture/dislocation/boney destruction.    Assesement: #1 Paronychia with ingrowing nail lateral border of the right hallux  #2 Pain in toe #3 Incurvated nail #4 plantar fasciitis right #5 Onychomycosis nails 1-5 bilateral  Plan of Care:  1. Patient evaluated. X-Rays reviewed.  2. Discussed treatment alternatives and plan of care. Explained nail avulsion procedure and post procedure course to patient. 3. Patient opted for  permanent partial nail avulsion of the lateral border of the right hallux.  4. Prior to procedure, local anesthesia infiltration utilized using 3 ml of a 50:50 mixture of 2% plain lidocaine and 0.5% plain marcaine in a normal hallux block fashion and a betadine prep performed.  5. Partial permanent nail avulsion with chemical matrixectomy performed using [id number] applications of phenol followed by alcohol flush.  6. Light dressing applied. 7. Injection of 0.5 mLs Celestone Soluspan injected into the right heel.  8. Plantar fascial brace dispensed.  9. Patient on medication for RA. Continue as directed by Rheumatologist.  10. Appointment with Janett Billow, RN for laser treatment. Lamisil caused nausea.  11. Return to clinic in 4 weeks.   Edrick Kins, DPM [facility]  Dr. Edrick Kins, [address]                                        [place],  [address]                Office [phone]  Fax [phone]

## 2018-12-08 ENCOUNTER — Other Ambulatory Visit: Payer: Self-pay | Admitting: Rheumatology

## 2018-12-08 DIAGNOSIS — L4050 Arthropathic psoriasis, unspecified: Secondary | ICD-10-CM

## 2018-12-10 NOTE — Telephone Encounter (Signed)
Last Visit: 09/03/18 Next Visit: 02/04/19 Labs: 10/22/18 WNL PLQ eye exam: 07/17/2018 normal.  Okay to refill per Dr Estanislado Pandy

## 2018-12-14 ENCOUNTER — Ambulatory Visit: Payer: Self-pay

## 2018-12-14 DIAGNOSIS — B351 Tinea unguium: Secondary | ICD-10-CM

## 2018-12-18 ENCOUNTER — Encounter: Payer: Self-pay | Admitting: Rheumatology

## 2018-12-18 NOTE — Progress Notes (Signed)
Pt presents with mycotic infection of nails 1-5 bilateral.  All other systems are negative  Laser therapy administered to affected nails and tolerated well. All safety precautions were in place.  2nd treatment.  Follow up in 4 weeks     

## 2018-12-27 ENCOUNTER — Other Ambulatory Visit: Payer: Self-pay | Admitting: *Deleted

## 2018-12-27 DIAGNOSIS — L409 Psoriasis, unspecified: Secondary | ICD-10-CM

## 2018-12-27 MED ORDER — OTEZLA 30 MG PO TABS: 30.0000 mg | ORAL_TABLET | Freq: Two times a day (BID) | ORAL | 0 refills | Status: DC

## 2018-12-27 NOTE — Telephone Encounter (Signed)
Refill request received via fax  Last Visit: 09/03/18 Next visit: 02/04/19 Labs: 10/22/18 WNL  Okay to refill per Dr. Estanislado Pandy

## 2019-01-07 ENCOUNTER — Other Ambulatory Visit: Payer: Self-pay | Admitting: Rheumatology

## 2019-01-07 DIAGNOSIS — L409 Psoriasis, unspecified: Secondary | ICD-10-CM

## 2019-01-11 ENCOUNTER — Encounter: Payer: Self-pay | Admitting: Podiatry

## 2019-01-11 ENCOUNTER — Other Ambulatory Visit: Payer: Self-pay

## 2019-01-11 ENCOUNTER — Ambulatory Visit (INDEPENDENT_AMBULATORY_CARE_PROVIDER_SITE_OTHER): Payer: 59 | Admitting: Podiatry

## 2019-01-11 VITALS — Temp 97.6°F

## 2019-01-11 DIAGNOSIS — M722 Plantar fascial fibromatosis: Secondary | ICD-10-CM

## 2019-01-11 DIAGNOSIS — L600 Ingrowing nail: Secondary | ICD-10-CM | POA: Diagnosis not present

## 2019-01-11 DIAGNOSIS — B351 Tinea unguium: Secondary | ICD-10-CM | POA: Diagnosis not present

## 2019-01-11 NOTE — Progress Notes (Signed)
   Subjective: Patient presents today for follow-up evaluation and treatment regarding a partial nail avulsion to the lateral border of the right great toe.  States that she feels much better.  She believes her ingrown is alleviated completely.  She experiences no pain to the lateral border of the right great nail plate.  She also presents for follow-up treatment evaluation for plantar fasciitis to the right foot.  Last visit on 11/23/2018 she received an injection in a plantar fascial brace which helped significantly.  Patient denies any pain at the moment. Finally patient presents for follow-up evaluation and treatment regarding onychomycosis to the nails 1-5 bilateral.  She received 1 laser treatment in our [place] office and she notices some improvement.  She presents for further treatment evaluation   Past Medical History:  Diagnosis Date  . Arthritis   . Endometriosis   . Hypertension   . Lupus ([place])     Objective:  General: Well developed, nourished, in no acute distress, alert and oriented x3   Dermatology: Skin is warm, dry and supple bilateral. Lateral border of the right hallux appears to be resolved with alleviation of the erythema and the pain to the lateral border of the nail plate.  There are no open sores, lesions. Hyperkeratotic, discolored, thickened, onychodystrophy of nails 1-5 noted bilaterally.   Vascular: Dorsalis Pedis artery and Posterior Tibial artery pedal pulses palpable. No lower extremity edema noted.   Neruologic: Grossly intact via light touch bilateral.  Musculoskeletal: Negative for pain with palpation to the right heel along the plantar fascia. Muscular strength within normal limits in all groups bilateral. Normal range of motion noted to all pedal and ankle joints.   Assesement: #1  Status post partial nail avulsion right hallux lateral border-resolved #2  plantar fasciitis right-resolved #3 Onychomycosis nails 1-5 bilateral  Plan of Care:  1.  Patient evaluated.   2.  Recommend good foot hygiene and good shoe gear 3.  Continue plantar fascial brace as needed 4.  Continue antifungal laser treatments as permitting at the moment due to COVID-19 5. Patient on medication for RA. Continue as directed by Rheumatologist.  6.  Return to clinic as needed  Edrick Kins, DPM [facility]  Dr. Edrick Kins, [address]                                        [place], [place] [address]                Office [phone]  Fax [phone]

## 2019-01-25 ENCOUNTER — Ambulatory Visit (INDEPENDENT_AMBULATORY_CARE_PROVIDER_SITE_OTHER): Payer: 59

## 2019-01-25 ENCOUNTER — Other Ambulatory Visit: Payer: Self-pay

## 2019-01-25 VITALS — Temp 98.6°F

## 2019-01-25 DIAGNOSIS — B351 Tinea unguium: Secondary | ICD-10-CM

## 2019-01-25 NOTE — Progress Notes (Signed)
Pt presents with mycotic infection of nails 1-5 bilateral.  All other systems are negative  Laser therapy administered to affected nails and tolerated well. All safety precautions were in place.  3rd treatment.  Follow up in 4 weeks     

## 2019-01-28 ENCOUNTER — Telehealth: Payer: Self-pay | Admitting: Rheumatology

## 2019-01-28 NOTE — Telephone Encounter (Signed)
Patient is working from home as of now, due to having Lupus. She is scheduled to RTW on May 4th. Patient was wondering if she could get a note from doctor to allow her to continue to work from home until [employer] over. Please call to advise.

## 2019-01-28 NOTE — Telephone Encounter (Signed)
Ok to provide work note for this patient.

## 2019-01-28 NOTE — Progress Notes (Signed)
Virtual Visit via Video Note  I connected with Patricia Ray on 02/04/19 at  8:45 AM EDT by a video enabled telemedicine application and verified that I am speaking with the correct person using two identifiers.   I discussed the limitations of evaluation and management by telemedicine and the availability of in person appointments. The patient expressed understanding and agreed to proceed. This service was conducted via virtual visit.  Both audio and visual tools were used.  The patient was located at home. I was located in my office.  Consent was obtained prior to the virtual visit and is aware of possible charges through their insurance for this visit.  The patient is an established patient.  Dr. Estanislado Pandy, MD conducted the virtual visit and Patricia Sams, PA-C acted as scribed during the service.  Office staff helped with scheduling follow up visits after the service was conducted.   CC: Right elbow joint pain   History of Present Illness: Patient is a 48 year old female with a past medical history of discoid lupus, psoriatic arthritis, and osteoarthritis.  She is on PLQ 200 mg BID and Otezla 30 mg BID.  She denies any discoid lesions.  She has been experiencing right elbow joint pain.  She states she experiences some numbness in the right hand at night.  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  She has no other joint pain or joint swelling.  She has no psoriasis at this time.  She has no discoid lesions or rashes at this time.   Review of Systems  Constitutional: Positive for malaise/fatigue. Negative for fever.  Eyes: Negative for photophobia, pain, discharge and redness.       +Eye dryness  Respiratory: Negative for cough, shortness of breath and wheezing.   Cardiovascular: Negative for chest pain and palpitations.  Gastrointestinal: Negative for blood in stool, constipation and diarrhea.  Genitourinary: Negative for dysuria.  Musculoskeletal: Positive for joint  pain. Negative for back pain, myalgias and neck pain.  Skin: Negative for rash.  Neurological: Negative for dizziness and headaches.  Psychiatric/Behavioral: Negative for depression. The patient is nervous/anxious and has insomnia.    Observations/Objective: Physical Exam  Constitutional: She is oriented to person, place, and time and well-developed, well-nourished, and in no distress.  HENT:  Head: Normocephalic and atraumatic.  Eyes: Conjunctivae are normal.  Pulmonary/Chest: Effort normal.  Neurological: She is alert and oriented to person, place, and time.  Psychiatric: Mood, memory, affect and judgment normal.   Patient reports morning stiffness for 5-10 minutes minutes.   Patient denies nocturnal pain.  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  She has no clinical features of mixed connective tissue at this point: She has no discoid lesions.  She has not had any recent rashes or flares.  She will continue on PLQ 200 mg by mouth twice daily.    Psoriasis -Scalp-She has no psoriasis at this time.  She is clinically doing well on Otezla 30 mg twice daily.  She is prescribed clobetasol which she can use PRN.   Psoriatic arthropathy ([place]) - Diagnosed by rheumatologist in [place].   She has not had any recent flares.  She has right elbow joint pain but no joint swelling.  She  has no other joint pain or joint swelling.  She has no achilles tendonitis or plantar fasciitis.  She has no SI joint pain.  She has no active psoriasis.  She is clinically doing well on Otezla 30 mg BID.  She does not need any refills at this time.  She was advised to notify us if she develops increased joint pain or joint  swelling.  High risk medication use -Current regimen includes Plaquenil 200 mg twice daily and Otezla 30 mg twice daily prescribed by her dermatologist. Last PLQ eye exam normal on 07/17/18. Allergy to MTX (hospitalized for 1 wk after taking MTX and simvastatin).  CBC and CMP WNL on 10/22/18.  She will be due for lab work in June and every 5 months.    Primary osteoarthritis of both knees - Osteoarthritis bilateral knees mild, bilateral moderate chondromalacia patella: She has occasional discomfort.  She has no joint swelling. She has no difficulty going up and down steps or getting up from a chair.   Right hand paresthesia: She has been experiencing numbness and tingling in the right thumb, index finger, and middle finger at night.  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 We recommended ordering a carpal tunnel brace.    Follow Up Instructions: She will follow up in 3 months.    I discussed the assessment and treatment plan with the patient. The patient was provided an opportunity to ask questions and all were answered. The patient agreed with the plan and demonstrated an understanding of the instructions.   The patient was advised to call back or seek an in-person evaluation if the symptoms worsen or if the condition fails to improve as anticipated.  I provided 25 minutes of non-face-to-face time during this encounter. Bo Merino, MD   Scribed by-  Ofilia Neas, PA-C

## 2019-01-28 NOTE — Telephone Encounter (Signed)
Okay to provide note

## 2019-01-28 NOTE — Telephone Encounter (Signed)
Letter has been sent via MyChart per patient's request.

## 2019-02-04 ENCOUNTER — Other Ambulatory Visit: Payer: Self-pay

## 2019-02-04 ENCOUNTER — Encounter: Payer: Self-pay | Admitting: Rheumatology

## 2019-02-04 ENCOUNTER — Telehealth (INDEPENDENT_AMBULATORY_CARE_PROVIDER_SITE_OTHER): Payer: 59 | Admitting: Rheumatology

## 2019-02-04 DIAGNOSIS — Z79899 Other long term (current) drug therapy: Secondary | ICD-10-CM

## 2019-02-04 DIAGNOSIS — L409 Psoriasis, unspecified: Secondary | ICD-10-CM

## 2019-02-04 DIAGNOSIS — R5383 Other fatigue: Secondary | ICD-10-CM

## 2019-02-04 DIAGNOSIS — E559 Vitamin D deficiency, unspecified: Secondary | ICD-10-CM

## 2019-02-04 DIAGNOSIS — L930 Discoid lupus erythematosus: Secondary | ICD-10-CM | POA: Diagnosis not present

## 2019-02-04 DIAGNOSIS — M170 Bilateral primary osteoarthritis of knee: Secondary | ICD-10-CM

## 2019-02-04 DIAGNOSIS — I10 Essential (primary) hypertension: Secondary | ICD-10-CM

## 2019-02-04 DIAGNOSIS — G4709 Other insomnia: Secondary | ICD-10-CM

## 2019-02-04 DIAGNOSIS — L4050 Arthropathic psoriasis, unspecified: Secondary | ICD-10-CM

## 2019-02-04 DIAGNOSIS — R202 Paresthesia of skin: Secondary | ICD-10-CM

## 2019-02-04 DIAGNOSIS — N200 Calculus of kidney: Secondary | ICD-10-CM

## 2019-02-05 NOTE — Telephone Encounter (Signed)
Further decision will be between the patient and her employer. We have given our recommendations.

## 2019-03-01 ENCOUNTER — Other Ambulatory Visit: Payer: 59

## 2019-03-04 ENCOUNTER — Encounter: Payer: Self-pay | Admitting: Rheumatology

## 2019-03-05 NOTE — Telephone Encounter (Signed)
Patient states she has been PLQ since she was 48 years old. Patient states she has had hives since December 2018. Patient states it started on her back. Patient states she was seen in the emergency room. Patient states when she was seen in the emergency room in 2018 she was advised to take benadryl. Patient states she has been working with her PCP to figure out the cause. Patient has been to see an allergist, all test where negative. Patient states the hives ae becoming more frequent. Patient is having to take benadryl daily and sometimes even twice a day. Patient is concerned that this may be a side effect from the PLQ. Please advise.

## 2019-03-06 NOTE — Telephone Encounter (Signed)
Patient called to check on status of message from yesterday.

## 2019-03-07 MED ORDER — PREDNISONE 5 MG PO TABS: ORAL_TABLET | ORAL | 0 refills | Status: DC

## 2019-03-07 NOTE — Progress Notes (Signed)
This encounter was created in error - please disregard.

## 2019-03-07 NOTE — Telephone Encounter (Signed)
Patient advised she should discontinue PLQ. Prescription for prednisone sent to the pharmacy. Patient advised to use sunscreen on a regular basis as discoid lupus can flare off PLQ. Patient advise that the only other option is MTX if she feels she can not tolerate PLQ. Patient verbalized understanding.

## 2019-03-07 NOTE — Telephone Encounter (Signed)
She should discontinue Plaquenil.  Please give her a prednisone taper starting at 20 mg and taper by 5 mg every 4 days.  She should use sunscreen on a regular basis as discoid lupus can flare off Plaquenil.  The only other option will be to put her on methotrexate if she cannot feel tolerate Plaquenil.  She had problems with elevated LFTs on methotrexate in the past.

## 2019-03-08 ENCOUNTER — Other Ambulatory Visit: Payer: Self-pay | Admitting: Rheumatology

## 2019-03-08 DIAGNOSIS — L4050 Arthropathic psoriasis, unspecified: Secondary | ICD-10-CM

## 2019-03-18 ENCOUNTER — Telehealth: Payer: Self-pay | Admitting: Rheumatology

## 2019-03-18 NOTE — Telephone Encounter (Signed)
Please advise patient to schedule an appointment to further discuss treatment options.

## 2019-03-18 NOTE — Telephone Encounter (Signed)
Patient is scheduled for an appointment 03/21/2019.

## 2019-03-18 NOTE — Progress Notes (Signed)
Office Visit Note  Patient: Patricia Ray             Date of Birth: 01-06-1971           MRN: [id number]             PCP: Sharyne Peach, MD Referring: Sharyne Peach, MD Visit Date: 03/21/2019 Occupation: @GUAROCC @  Subjective:  Hives   History of Present Illness: Patricia Ray is a 48 y.o. female with history of discoid lupus and psoriatic arthritis.  She is taking Otezla 30 mg twice daily for management of psoriatic arthritis.  She has not any recent psoriatic arthritis flares.  She has no joint pain or joint swelling at this time.  She expresses some stiffness in her hands worsening in the morning. She wears copper gloves, which have been helpful.  She states the numbness she was experiencing in her right hand has resolved.  She denies any achilles tendonitis or plantar fasciitis. She denies any psoriasis at this time. She states that she recently discontinued Plaquenil due to wondering if it is contributing to the hives she has been experiencing.  She states that she has been having hives on a daily basis since December 2018.  She states that she has seen an allergist as well as her PCP in the past.  She was tested for mold exposure on Tuesday but has not gotten the results back yet.  She did not notice any difference while being off of Plaquenil.  She states that while on prednisone she did not have any resolution of symptoms.  She has been taking Benadryl a daily basis which provides minimal relief and makes her drowsy.  She has not been evaluated by dermatologist yet.  She denies any Maller rash at this time.  She states that she has a small discoid lesion on her left thigh.  She plans on applying topical agents which usually resolves the lesions.  She denies any sores in her mouth or nose.  She denies any symptoms of Raynaud's recently.  She continues to have eye dryness but denies any mouth dryness.  She denies any palpitations or shortness of breath.  She continues have  photosensitivity and avoids direct sunlight. Prednisone    Activities of Daily Living:  Patient reports morning stiffness for 20 minutes.   Patient Denies nocturnal pain.  Difficulty dressing/grooming: Denies Difficulty climbing stairs: Denies Difficulty getting out of chair: Denies Difficulty using hands for taps, buttons, cutlery, and/or writing: Reports  Review of Systems  Constitutional: Positive for fatigue.  HENT: Negative for mouth sores, mouth dryness and nose dryness.   Eyes: Positive for dryness. Negative for pain and visual disturbance.  Respiratory: Negative for cough, hemoptysis, shortness of breath, wheezing and difficulty breathing.   Cardiovascular: Negative for chest pain, palpitations, hypertension and swelling in legs/feet.  Gastrointestinal: Negative for blood in stool, constipation and diarrhea.  Endocrine: Negative for increased urination.  Genitourinary: Negative for difficulty urinating and painful urination.  Musculoskeletal: Positive for arthralgias, joint pain, joint swelling and morning stiffness. Negative for myalgias, muscle weakness, muscle tenderness and myalgias.  Skin: Positive for rash. Negative for color change, pallor, hair loss, nodules/bumps, skin tightness, ulcers and sensitivity to sunlight.  Allergic/Immunologic: Negative for susceptible to infections.  Neurological: Negative for dizziness, headaches and weakness.  Hematological: Negative for swollen glands.  Psychiatric/Behavioral: Negative for depressed mood and sleep disturbance. The patient is not nervous/anxious.     PMFS History:  Patient Active Problem List   Diagnosis  Date Noted   Primary osteoarthritis of both knees 08/23/2018   Psoriatic arthritis ([place]) 05/10/2018   Psoriasis of scalp 05/10/2018   Discoid lupus 05/10/2018   High risk medication use 05/10/2018   Essential hypertension 05/10/2018   Other insomnia 05/10/2018   Elevated prolactin level (HCC) 05/10/2018    Vitamin D insufficiency 05/10/2018   Lupus erythematosus 04/11/2018   Inflammatory polyarthropathy ([place]) 04/11/2018   Renal stone 02/21/2016   Notalgia 02/07/2016   Increased prolactin level ([place]) 03/19/2015    Past Medical History:  Diagnosis Date   Arthritis    Endometriosis    Hypertension    Lupus ([place])    Sleep apnea     Family History  Problem Relation Age of Onset   Hypercalcemia Mother    High Cholesterol Mother    Colon cancer Maternal Grandmother    Diabetes Father    Bipolar disorder Sister    Depression Sister    Breast cancer Neg Hx    Past Surgical History:  Procedure Laterality Date   BREAST BIOPSY Right 04/04/2018   pending path   BUNIONECTOMY     CESAREAN SECTION     FOOT SURGERY     REDUCTION MAMMAPLASTY  2015   TONSILLECTOMY     TUBAL LIGATION     Social History   Social History Narrative   Not on file   Immunization History  Administered Date(s) Administered   Influenza,inj,Quad PF,6+ Mos 09/16/2016   Influenza,inj,quad, With Preservative 09/26/2016   Influenza-Unspecified 07/12/2017, 07/12/2018   Pneumococcal Polysaccharide-23 11/04/2016   Tdap 11/04/2016     Objective: Vital Signs: BP 124/80 (BP Location: Left Arm, Patient Position: Sitting, Cuff Size: Normal)    Pulse (!) 115    Resp 14    Ht 5' 2.5" (1.588 m)    Wt 193 lb 9.6 oz (87.8 kg)    LMP 10/02/2014    BMI 34.85 kg/m    Physical Exam Vitals signs and nursing note reviewed.  Constitutional:      Appearance: She is well-developed.  HENT:     Head: Normocephalic and atraumatic.  Eyes:     Conjunctiva/sclera: Conjunctivae normal.  Neck:     Musculoskeletal: Normal range of motion.  Cardiovascular:     Rate and Rhythm: Normal rate and regular rhythm.     Heart sounds: Normal heart sounds.  Pulmonary:     Effort: Pulmonary effort is normal.     Breath sounds: Normal breath sounds.  Abdominal:     General: Bowel sounds are normal.      Palpations: Abdomen is soft.  Lymphadenopathy:     Cervical: No cervical adenopathy.  Skin:    General: Skin is warm and dry.     Capillary Refill: Capillary refill takes less than 2 seconds.     Comments: 1 small discoid lesion present on the anterior aspect of the left upper thigh.  Neurological:     Mental Status: She is alert and oriented to person, place, and time.  Psychiatric:        Behavior: Behavior normal.      Musculoskeletal Exam: C-spine, thoracic spine, and lumbar spine good ROM.  No midline spinal tenderness.  No SI joint tenderness.  Shoulder joints, elbow joints, wrist joints, MCPs, PIPs, and DIPs good ROM with no synovitis.  Hip joints, knee joints, ankle joints, MTPs, PIPs, and DIPs good ROM with no synovitis.  No warmth or effusion of knee joints.  No tenderness or swelling of ankle joints.  CDAI Exam: CDAI Score: -- Patient Global: --; Provider Global: -- Swollen: --; Tender: -- Joint Exam   No joint exam has been documented for this visit   There is currently no information documented on the homunculus. Go to the Rheumatology activity and complete the homunculus joint exam.  Investigation: No additional findings.  Imaging: No results found.  Recent Labs: Lab Results  Component Value Date   WBC 8.1 10/22/2018   HGB 14.9 10/22/2018   PLT 307 10/22/2018   NA 142 10/22/2018   K 4.4 10/22/2018   CL 108 10/22/2018   CO2 26 10/22/2018   GLUCOSE 85 10/22/2018   BUN 11 10/22/2018   CREATININE 0.76 10/22/2018   BILITOT 0.5 10/22/2018   ALKPHOS 58 03/14/2017   AST 16 10/22/2018   ALT 13 10/22/2018   PROT 6.5 10/22/2018   ALBUMIN 4.5 03/14/2017   CALCIUM 9.3 10/22/2018   GFRAA 108 10/22/2018   QFTBGOLDPLUS NEGATIVE 05/10/2018    Speciality Comments: PLQ eye exam: 07/17/2018 normal. [facility]. Follow up in 6 months. Prior therapy: MTX (allergy-hospitalized for 1 wk after taking MTX and simvastatin).    Procedures:  No procedures  performed Allergies: Simvastatin, Methotrexate, and Nickel   Assessment / Plan:     Visit Diagnoses: Discoid lupus -RNP+, ANA 1:160 speckled, Ro+: She has one small discoid lesion present on the anterior surface of the left upper thigh.  Plan: She was advised to apply hydroquinone 4% cream as needed.  She was encouraged to wear sunscreen on a daily basis.  She was advised to wear widebrimmed hats and sun protective clothing.  We will check autoimmune lab work on a yearly basis.  She is advised to notify us if she develops any new or worsening rashes.  She will restart taking Plaquenil as prescribed.  Psoriatic arthropathy ([place]) -she has no synovitis or dactylitis on exam.  She has not had any recent psoriatic arthritis flares.  She is clinically doing well on Otezla 30 mg 1 tablet by mouth twice daily.  She has no joint pain or joint swelling at this time.  She experiences morning stiffness in bilateral hands.  She has been wearing copper gloves which has been helping with the stiffness and arthralgias.  She has no Achilles denies or plantar fasciitis.  She has no SI joint tenderness.   Psoriasis - Plan: She will continue taking Otezla as prescribed.  She was advised to notify us if she develops increased joint pain or joint swelling.  She will follow-up in the office in 5 months.  High risk medication use - PLQ and Otezlad/c PLQ due to thinking it was causing hives. would like to discuss treatment options  - Plan: CBC with Differential/Platelet, COMPLETE METABOLIC PANEL WITH GFR,   Primary osteoarthritis of both knees -She has good range of motion with no discomfort.  No warmth or effusion was noted.  Plan: She was encouraged to stay active and exercise on a regular basis  Right hand paresthesia - Resolved.  She has been wearing copper gloves at night which has helped with the paresthesias she was been experiencing plan: She was advised to notify us if her symptoms return.  Vitamin D insufficiency -  Plan: She will continue taking vitamin D supplement.  Other insomnia - Plan: She has been sleeping ok at night.   Other fatigue - Plan: Stable.   Other medical conditions are listed as follows:  Essential hypertension -   Renal stone    Orders:  Orders Placed This Encounter  Procedures   CBC with Differential/Platelet   COMPLETE METABOLIC PANEL WITH GFR   No orders of the defined types were placed in this encounter.   Face-to-face time spent with patient was 30 minutes. Greater than 50% of time was spent in counseling and coordination of care.  Follow-Up Instructions: Return in about 5 months (around 08/21/2019) for Discoid lesion, Psoriatic arthritis.   Ofilia Neas, PA-C   I examined and evaluated the patient with Hazel Sams PA.  Patient had no synovitis on my examination today.  Her psoriatic arthritis and psoriasis is very well controlled with [place] Nail.  She had a stopped Plaquenil as she was concerned that the hives are related to Plaquenil.  Although she has not noticed any difference in stopping Plaquenil.  We reviewed her serology and advised her to resume Plaquenil.  The plan of care was discussed as noted above.  Bo Merino, MD  Note - This record has been created using Editor, commissioning.  Chart creation errors have been sought, but may not always  have been located. Such creation errors do not reflect on  the standard of medical care.

## 2019-03-18 NOTE — Telephone Encounter (Signed)
Patient called stating she finished the prescription of Prednisone and is still experiencing hives.  Patient states she does not think the hives are from the Plaquenil.  Patient states she thinks the hives are from mold exposure in her apartment.  Patient states she will ask her PCP to test her for mold exposure.  Patient is requesting a return call to discuss how to move forward with medications.

## 2019-03-21 ENCOUNTER — Other Ambulatory Visit: Payer: Self-pay | Admitting: Family Medicine

## 2019-03-21 ENCOUNTER — Ambulatory Visit (INDEPENDENT_AMBULATORY_CARE_PROVIDER_SITE_OTHER): Payer: 59 | Admitting: Rheumatology

## 2019-03-21 ENCOUNTER — Encounter: Payer: Self-pay | Admitting: Physician Assistant

## 2019-03-21 ENCOUNTER — Other Ambulatory Visit: Payer: Self-pay

## 2019-03-21 VITALS — BP 124/80 | HR 115 | Resp 14 | Ht 62.5 in | Wt 193.6 lb

## 2019-03-21 DIAGNOSIS — Z79899 Other long term (current) drug therapy: Secondary | ICD-10-CM

## 2019-03-21 DIAGNOSIS — Z1231 Encounter for screening mammogram for malignant neoplasm of breast: Secondary | ICD-10-CM

## 2019-03-21 DIAGNOSIS — L409 Psoriasis, unspecified: Secondary | ICD-10-CM

## 2019-03-21 DIAGNOSIS — I10 Essential (primary) hypertension: Secondary | ICD-10-CM

## 2019-03-21 DIAGNOSIS — R5383 Other fatigue: Secondary | ICD-10-CM

## 2019-03-21 DIAGNOSIS — L4050 Arthropathic psoriasis, unspecified: Secondary | ICD-10-CM

## 2019-03-21 DIAGNOSIS — E559 Vitamin D deficiency, unspecified: Secondary | ICD-10-CM

## 2019-03-21 DIAGNOSIS — L930 Discoid lupus erythematosus: Secondary | ICD-10-CM

## 2019-03-21 DIAGNOSIS — R202 Paresthesia of skin: Secondary | ICD-10-CM

## 2019-03-21 DIAGNOSIS — G4709 Other insomnia: Secondary | ICD-10-CM

## 2019-03-21 DIAGNOSIS — N200 Calculus of kidney: Secondary | ICD-10-CM

## 2019-03-21 DIAGNOSIS — M170 Bilateral primary osteoarthritis of knee: Secondary | ICD-10-CM

## 2019-03-22 ENCOUNTER — Other Ambulatory Visit: Payer: 59

## 2019-03-22 LAB — CBC WITH DIFFERENTIAL/PLATELET
Absolute Monocytes: 792 cells/uL (ref 200–950)
Basophils Absolute: 36 cells/uL (ref 0–200)
Basophils Relative: 0.4 %
Eosinophils Absolute: 89 cells/uL (ref 15–500)
Eosinophils Relative: 1 %
HCT: 44 % (ref 35.0–45.0)
Hemoglobin: 15 g/dL (ref 11.7–15.5)
Lymphs Abs: 1816 cells/uL (ref 850–3900)
MCH: 29.5 pg (ref 27.0–33.0)
MCHC: 34.1 g/dL (ref 32.0–36.0)
MCV: 86.4 fL (ref 80.0–100.0)
MPV: 10 fL (ref 7.5–12.5)
Monocytes Relative: 8.9 %
Neutro Abs: 6168 cells/uL (ref 1500–7800)
Neutrophils Relative %: 69.3 %
Platelets: 275 10*3/uL (ref 140–400)
RBC: 5.09 10*6/uL (ref 3.80–5.10)
RDW: 14.2 % (ref 11.0–15.0)
Total Lymphocyte: 20.4 %
WBC: 8.9 10*3/uL (ref 3.8–10.8)

## 2019-03-22 LAB — COMPLETE METABOLIC PANEL WITH GFR
AG Ratio: 1.9 (calc) (ref 1.0–2.5)
ALT: 17 U/L (ref 6–29)
AST: 14 U/L (ref 10–35)
Albumin: 4.1 g/dL (ref 3.6–5.1)
Alkaline phosphatase (APISO): 55 U/L (ref 31–125)
BUN: 17 mg/dL (ref 7–25)
CO2: 26 mmol/L (ref 20–32)
Calcium: 9.5 mg/dL (ref 8.6–10.2)
Chloride: 107 mmol/L (ref 98–110)
Creat: 0.67 mg/dL (ref 0.50–1.10)
GFR, Est African American: 120 mL/min/{1.73_m2} (ref >=60)
GFR, Est Non African American: 103 mL/min/{1.73_m2} (ref >=60)
Globulin: 2.2 g/dL (calc) (ref 1.9–3.7)
Glucose, Bld: 106 mg/dL — ABNORMAL HIGH (ref 65–99)
Potassium: 4.5 mmol/L (ref 3.5–5.3)
Sodium: 142 mmol/L (ref 135–146)
Total Bilirubin: 0.5 mg/dL (ref 0.2–1.2)
Total Protein: 6.3 g/dL (ref 6.1–8.1)

## 2019-03-22 NOTE — Progress Notes (Signed)
Glucose is 106. Rest of CMP WNL.  CBC WNL

## 2019-03-24 ENCOUNTER — Other Ambulatory Visit: Payer: Self-pay | Admitting: Rheumatology

## 2019-03-24 DIAGNOSIS — L409 Psoriasis, unspecified: Secondary | ICD-10-CM

## 2019-03-25 NOTE — Telephone Encounter (Signed)
Last Visit: 03/21/2019 Next Visit: 05/06/2019  Okay to refill per Dr. Estanislado Pandy.

## 2019-03-26 ENCOUNTER — Ambulatory Visit (INDEPENDENT_AMBULATORY_CARE_PROVIDER_SITE_OTHER): Payer: 59

## 2019-03-26 ENCOUNTER — Other Ambulatory Visit: Payer: Self-pay

## 2019-03-26 DIAGNOSIS — B351 Tinea unguium: Secondary | ICD-10-CM

## 2019-03-27 NOTE — Progress Notes (Signed)
Pt presents with mycotic infection of nails 1-5 bilateral.  All other systems are negative  Laser therapy administered to affected nails and tolerated well. All safety precautions were in place.  3rd treatment.  Follow up in 4 weeks     

## 2019-03-28 ENCOUNTER — Other Ambulatory Visit: Payer: Self-pay | Admitting: Rheumatology

## 2019-03-28 DIAGNOSIS — L409 Psoriasis, unspecified: Secondary | ICD-10-CM

## 2019-03-29 ENCOUNTER — Other Ambulatory Visit: Payer: Self-pay

## 2019-03-29 ENCOUNTER — Ambulatory Visit
Admission: RE | Admit: 2019-03-29 | Discharge: 2019-03-29 | Disposition: A | Discharge status: Home / Self Care | Payer: 59 | Source: Ambulatory Visit | Attending: Family Medicine | Admitting: Family Medicine

## 2019-03-29 DIAGNOSIS — Z1231 Encounter for screening mammogram for malignant neoplasm of breast: Secondary | ICD-10-CM | POA: Diagnosis present

## 2019-04-22 NOTE — Progress Notes (Signed)
Office Visit Note  Patient: Patricia Ray             Date of Birth: July 27, 1971           MRN: [id number]             PCP: Sharyne Peach, MD Referring: Sharyne Peach, MD Visit Date: 05/06/2019 Occupation: @GUAROCC @  Subjective:  No chief complaint on file.   History of Present Illness: Patricia Ray is a 48 y.o. female with history of psoriatic arthritis and discoid lupus.  She states she has been having pain and discomfort in her right elbow for the last 7 months.  She saw an orthopedic surgeon at [facility].  She was told that it is tendinitis.  She was placed in a wrist brace.  And was also placed on meloxicam.  She denies any rash from psoriasis or discoid lupus.  She states Rutherford Nail is working well to control psoriatic arthritis.  She denies any joint swelling.  She has had some intermittent joint pain.  Patient states she was recently diagnosed with sleep apnea and she has been using CPAP which is been helpful.  Activities of Daily Living:  Patient reports morning stiffness for 20 minutes.   Patient Denies nocturnal pain.  Difficulty dressing/grooming: Denies Difficulty climbing stairs: Denies Difficulty getting out of chair: Denies Difficulty using hands for taps, buttons, cutlery, and/or writing: Denies  Review of Systems  Constitutional: Negative for fatigue, night sweats, weight gain and weight loss.  HENT: Negative for mouth sores, trouble swallowing, trouble swallowing, mouth dryness and nose dryness.   Eyes: Positive for dryness. Negative for pain, redness and visual disturbance.  Respiratory: Negative for cough, shortness of breath and difficulty breathing.   Cardiovascular: Negative for chest pain, palpitations, hypertension, irregular heartbeat and swelling in legs/feet.  Gastrointestinal: Negative for blood in stool, constipation and diarrhea.  Endocrine: Negative for increased urination.  Genitourinary: Negative for vaginal dryness.  Musculoskeletal:  Positive for arthralgias and joint pain. Negative for joint swelling, myalgias, muscle weakness, morning stiffness, muscle tenderness and myalgias.  Skin: Negative for color change, rash, hair loss, skin tightness, ulcers and sensitivity to sunlight.  Allergic/Immunologic: Negative for susceptible to infections.  Neurological: Negative for dizziness, memory loss, night sweats and weakness.  Hematological: Negative for swollen glands.  Psychiatric/Behavioral: Negative for depressed mood and sleep disturbance. The patient is not nervous/anxious.     PMFS History:  Patient Active Problem List   Diagnosis Date Noted  . Primary osteoarthritis of both knees 08/23/2018  . Psoriatic arthritis ([place]) 05/10/2018  . Psoriasis of scalp 05/10/2018  . 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 High risk medication use 05/10/2018  . Essential hypertension 05/10/2018  . Other insomnia 05/10/2018  . Elevated prolactin level ([place]) 05/10/2018  . Vitamin D insufficiency 05/10/2018  . Lupus erythematosus 04/11/2018  . Inflammatory polyarthropathy ([place]) 04/11/2018  . Renal stone 02/21/2016  . Notalgia 02/07/2016  . Increased prolactin level ([place]) 03/19/2015    Past Medical History:  Diagnosis Date  . Arthritis   . Endometriosis   . Hypertension   . Lupus ([place])   . Sleep apnea     Family History  Problem Relation Age of Onset  . Hypercalcemia Mother   . High Cholesterol Mother   . Colon cancer Maternal Grandmother   . Diabetes Father   . Bipolar disorder Sister   . Depression Sister   . Breast cancer Neg Hx    Past Surgical History:  Procedure Laterality Date  .  BREAST BIOPSY Right 04/04/2018   BENIGN EPIDERMAL INCLUSION CYST   . BUNIONECTOMY    . CESAREAN SECTION    . FOOT SURGERY    . REDUCTION MAMMAPLASTY  2005  . TONSILLECTOMY    . TUBAL LIGATION     Social History   Social History Narrative  . Not on file   Immunization History  Administered Date(s) Administered  . Influenza,inj,Quad  PF,6+ Mos 09/16/2016  . Influenza,inj,quad, With Preservative 09/26/2016  . Influenza-Unspecified 07/12/2017, 07/12/2018  . Pneumococcal Polysaccharide-23 11/04/2016  . Tdap 11/04/2016     Objective: Vital Signs: BP 120/88 (BP Location: Left Arm, Patient Position: Sitting, Cuff Size: Normal)   Pulse 100   Resp 14   Ht 5' 2.5" (1.588 m)   Wt 195 lb (88.5 kg)   LMP 10/02/2014   BMI 35.10 kg/m    Physical Exam Vitals signs and nursing note reviewed.  Constitutional:      Appearance: She is well-developed.  HENT:     Head: Normocephalic and atraumatic.  Eyes:     Conjunctiva/sclera: Conjunctivae normal.  Neck:     Musculoskeletal: Normal range of motion.  Cardiovascular:     Rate and Rhythm: Normal rate and regular rhythm.     Heart sounds: Normal heart sounds.  Pulmonary:     Effort: Pulmonary effort is normal.     Breath sounds: Normal breath sounds.  Abdominal:     General: Bowel sounds are normal.     Palpations: Abdomen is soft.  Lymphadenopathy:     Cervical: No cervical adenopathy.  Skin:    General: Skin is warm and dry.     Capillary Refill: Capillary refill takes less than 2 seconds.  Neurological:     Mental Status: She is alert and oriented to person, place, and time.  Psychiatric:        Behavior: Behavior normal.      Musculoskeletal Exam: C-spine thoracic and lumbar spine with good range of motion.  Shoulder joints elbow joints wrist joint MCPs PIPs DIPs with good range of motion with no synovitis.  Hip joints knee joints ankles MTPs PIPs with good range of motion with no synovitis.  She has tenderness on palpation of her medial aspect of her right elbow.  CDAI Exam: CDAI Score: 0.4  Patient Global: 2 mm; Provider Global: 2 mm Swollen: 0 ; Tender: 0  Joint Exam   No joint exam has been documented for this visit   There is currently no information documented on the homunculus. Go to the Rheumatology activity and complete the homunculus joint exam.   Investigation: No additional findings.  Imaging: No results found.  Recent Labs: Lab Results  Component Value Date   WBC 8.9 03/21/2019   HGB 15.0 03/21/2019   PLT 275 03/21/2019   NA 142 03/21/2019   K 4.5 03/21/2019   CL 107 03/21/2019   CO2 26 03/21/2019   GLUCOSE 106 (H) 03/21/2019   BUN 17 03/21/2019   CREATININE 0.67 03/21/2019   BILITOT 0.5 03/21/2019   ALKPHOS 58 03/14/2017   AST 14 03/21/2019   ALT 17 03/21/2019   PROT 6.3 03/21/2019   ALBUMIN 4.5 03/14/2017   CALCIUM 9.5 03/21/2019   GFRAA 120 03/21/2019   QFTBGOLDPLUS NEGATIVE 05/10/2018    Speciality Comments: PLQ eye exam: 03/13/2019 normal. [facility]. Follow up in 6 months. Prior therapy: MTX (allergy-hospitalized for 1 wk after taking MTX and simvastatin).  Procedures:  No procedures performed Allergies: Simvastatin, Methotrexate, and Nickel  Assessment / Plan:     Visit Diagnoses: Discoid lupus - RNP+, ANA 1:160 speckled, Ro+hydroquinone 4% cream as needed -patient uses topical agents.  She never developed systemic features of the autoimmune disease.  Use of sunscreen was emphasized.  Psoriatic arthropathy ([place]) -patient has no active synovitis on examination.  She has been using [place] on regular basis.  Psoriasis -she has no active psoriasis lesions.  High risk medication use - PLQ 200 mg p.o. twice daily and Otezla 30 mg BID -her labs have been stable.  She states she has been getting her eye exam on regular basis.  Primary osteoarthritis of both knees -she is currently not having much discomfort.  Weight loss diet and exercise was discussed at length.  Right hand paresthesia -she was evaluated by orthopedics and is currently using a brace.  She will follow-up with them.  Essential hypertension -her blood pressure is controlled today.  Renal stone   Vitamin D insufficiency -she takes supplement.  OSA (obstructive sleep apnea) - on CPAP.  Her insomnia has improved since she has been using  CPAP.  Association of heart disease with psoriatic arthritis was discussed. Need to monitor blood pressure, cholesterol, and to exercise 30-60 minutes on daily basis was discussed.   Orders: No orders of the defined types were placed in this encounter.  No orders of the defined types were placed in this encounter.   Follow-Up Instructions: Return in about 6 months (around 11/06/2019) for Psoriatic arthritis, DLE.   Bo Merino, MD  Note - This record has been created using Editor, commissioning.  Chart creation errors have been sought, but may not always  have been located. Such creation errors do not reflect on  the standard of medical care.

## 2019-04-25 ENCOUNTER — Ambulatory Visit: Payer: 59

## 2019-05-06 ENCOUNTER — Encounter: Payer: Self-pay | Admitting: Physician Assistant

## 2019-05-06 ENCOUNTER — Ambulatory Visit (INDEPENDENT_AMBULATORY_CARE_PROVIDER_SITE_OTHER): Payer: 59 | Admitting: Rheumatology

## 2019-05-06 ENCOUNTER — Other Ambulatory Visit: Payer: Self-pay

## 2019-05-06 ENCOUNTER — Ambulatory Visit: Payer: 59

## 2019-05-06 VITALS — BP 120/88 | HR 100 | Resp 14 | Ht 62.5 in | Wt 195.0 lb

## 2019-05-06 DIAGNOSIS — N200 Calculus of kidney: Secondary | ICD-10-CM

## 2019-05-06 DIAGNOSIS — L409 Psoriasis, unspecified: Secondary | ICD-10-CM

## 2019-05-06 DIAGNOSIS — M170 Bilateral primary osteoarthritis of knee: Secondary | ICD-10-CM

## 2019-05-06 DIAGNOSIS — G4733 Obstructive sleep apnea (adult) (pediatric): Secondary | ICD-10-CM

## 2019-05-06 DIAGNOSIS — R202 Paresthesia of skin: Secondary | ICD-10-CM

## 2019-05-06 DIAGNOSIS — L930 Discoid lupus erythematosus: Secondary | ICD-10-CM | POA: Diagnosis not present

## 2019-05-06 DIAGNOSIS — I10 Essential (primary) hypertension: Secondary | ICD-10-CM

## 2019-05-06 DIAGNOSIS — L4050 Arthropathic psoriasis, unspecified: Secondary | ICD-10-CM | POA: Diagnosis not present

## 2019-05-06 DIAGNOSIS — Z79899 Other long term (current) drug therapy: Secondary | ICD-10-CM | POA: Diagnosis not present

## 2019-05-06 DIAGNOSIS — E559 Vitamin D deficiency, unspecified: Secondary | ICD-10-CM

## 2019-05-06 NOTE — Patient Instructions (Signed)
Standing Labs We placed an order today for your standing lab work.    Please come back and get your standing labs in November every 5 months  We have open lab daily Monday through Thursday from 8:30-12:30 PM and 1:30-4:30 PM and Friday from 8:30-12:30 PM and 1:30 -4:00 PM at the office of Dr. Bo Merino.   You may experience shorter wait times on Monday and Friday afternoons. The office is located at [address] [place], [place], [place] [address] No appointment is necessary.   Labs are drawn by [facility].  You may receive a bill from [facility] for your lab work.  If you wish to have your labs drawn at another location, please call the office 24 hours in advance to send orders.  If you have any questions regarding directions or hours of operation,  please call [phone].   Just as a reminder please drink plenty of water prior to coming for your lab work. Thanks!

## 2019-05-07 ENCOUNTER — Ambulatory Visit: Payer: 59

## 2019-05-30 ENCOUNTER — Telehealth: Payer: Self-pay | Admitting: Rheumatology

## 2019-05-30 NOTE — Telephone Encounter (Signed)
Patient calling because last form filled out for disability was not filled out completely. Hours/ days for FMLA has to be checked. [employer] sent paperwork to [place] Monday to fill out, but has not received it back from [place] yet. Please call patient to advise.

## 2019-05-30 NOTE — Telephone Encounter (Signed)
Patient advised I received the paperwork in the office on 05/24/19 and have filled it out and faxed it  Back to them on 05/24/19. Patient advised I spoke with [employer] yesterday to advised them of this and they asked for me to send them a copy and I advised I would and that I sent them a copy in the sleeve in which her paperwork was in. Patient states she will call her caseworker and call me back if I need to refax the paperwork.

## 2019-05-31 ENCOUNTER — Telehealth: Payer: Self-pay | Admitting: Rheumatology

## 2019-05-31 NOTE — Telephone Encounter (Signed)
Patient called stating Patricia Ray told her to call back today if her case worker doesn't receive her paperwork.  Patient states the paperwork needs to be refaxed today to [phone].  Patient states her case worker told her to refax it twice.

## 2019-05-31 NOTE — Telephone Encounter (Signed)
Per last telephone encounter, [name] sent the paperwork back to [name]. I contacted [name] and she will refax paperwork to the caseworker. Advised patient, she verbalized understanding and states her paperwork is due on 06/04/2019.

## 2019-06-15 ENCOUNTER — Other Ambulatory Visit: Payer: Self-pay | Admitting: Rheumatology

## 2019-06-15 DIAGNOSIS — L409 Psoriasis, unspecified: Secondary | ICD-10-CM

## 2019-06-18 NOTE — Telephone Encounter (Signed)
Last Visit: 05/06/19 Next Visit: 11/04/19  Okay to refill per Dr. Estanislado Pandy

## 2019-06-19 ENCOUNTER — Other Ambulatory Visit: Payer: Self-pay | Admitting: Rheumatology

## 2019-06-19 DIAGNOSIS — L409 Psoriasis, unspecified: Secondary | ICD-10-CM

## 2019-06-23 ENCOUNTER — Emergency Department: Payer: 59

## 2019-06-23 ENCOUNTER — Emergency Department
Admission: EM | Admit: 2019-06-23 | Discharge: 2019-06-23 | Disposition: A | Discharge status: Home / Self Care | Payer: 59 | Attending: Emergency Medicine | Admitting: Emergency Medicine

## 2019-06-23 ENCOUNTER — Other Ambulatory Visit: Payer: Self-pay

## 2019-06-23 ENCOUNTER — Encounter: Payer: Self-pay | Admitting: Emergency Medicine

## 2019-06-23 DIAGNOSIS — F17210 Nicotine dependence, cigarettes, uncomplicated: Secondary | ICD-10-CM | POA: Insufficient documentation

## 2019-06-23 DIAGNOSIS — I10 Essential (primary) hypertension: Secondary | ICD-10-CM | POA: Diagnosis not present

## 2019-06-23 DIAGNOSIS — N200 Calculus of kidney: Secondary | ICD-10-CM

## 2019-06-23 DIAGNOSIS — N201 Calculus of ureter: Secondary | ICD-10-CM | POA: Diagnosis not present

## 2019-06-23 DIAGNOSIS — Z79899 Other long term (current) drug therapy: Secondary | ICD-10-CM | POA: Insufficient documentation

## 2019-06-23 DIAGNOSIS — R1032 Left lower quadrant pain: Secondary | ICD-10-CM | POA: Diagnosis present

## 2019-06-23 LAB — URINALYSIS, COMPLETE (UACMP) WITH MICROSCOPIC
Bacteria, UA: NONE SEEN
Bilirubin Urine: NEGATIVE
Glucose, UA: NEGATIVE mg/dL
Ketones, ur: NEGATIVE mg/dL
Leukocytes,Ua: NEGATIVE
Nitrite: NEGATIVE
Protein, ur: 100 mg/dL — AB
RBC / HPF: >50 RBC/hpf — ABNORMAL HIGH (ref 0–5)
Specific Gravity, Urine: 1.027 (ref 1.005–1.030)
pH: 6 (ref 5.0–8.0)

## 2019-06-23 LAB — COMPREHENSIVE METABOLIC PANEL
ALT: 21 U/L (ref 0–44)
AST: 23 U/L (ref 15–41)
Albumin: 3.9 g/dL (ref 3.5–5.0)
Alkaline Phosphatase: 58 U/L (ref 38–126)
Anion gap: 8 (ref 5–15)
BUN: 10 mg/dL (ref 6–20)
CO2: 25 mmol/L (ref 22–32)
Calcium: 8.7 mg/dL — ABNORMAL LOW (ref 8.9–10.3)
Chloride: 110 mmol/L (ref 98–111)
Creatinine, Ser: 0.76 mg/dL (ref 0.44–1.00)
GFR calc Af Amer: >60 mL/min (ref >60)
GFR calc non Af Amer: >60 mL/min (ref >60)
Glucose, Bld: 112 mg/dL — ABNORMAL HIGH (ref 70–99)
Potassium: 4.4 mmol/L (ref 3.5–5.1)
Sodium: 143 mmol/L (ref 135–145)
Total Bilirubin: 0.8 mg/dL (ref 0.3–1.2)
Total Protein: 6.9 g/dL (ref 6.5–8.1)

## 2019-06-23 LAB — POCT PREGNANCY, URINE: Preg Test, Ur: NEGATIVE

## 2019-06-23 LAB — CBC
HCT: 46.5 % — ABNORMAL HIGH (ref 36.0–46.0)
Hemoglobin: 15.5 g/dL — ABNORMAL HIGH (ref 12.0–15.0)
MCH: 28.9 pg (ref 26.0–34.0)
MCHC: 33.3 g/dL (ref 30.0–36.0)
MCV: 86.8 fL (ref 80.0–100.0)
Platelets: 291 10*3/uL (ref 150–400)
RBC: 5.36 MIL/uL — ABNORMAL HIGH (ref 3.87–5.11)
RDW: 14.5 % (ref 11.5–15.5)
WBC: 9.9 10*3/uL (ref 4.0–10.5)
nRBC: 0 % (ref 0.0–0.2)

## 2019-06-23 MED ORDER — ONDANSETRON 4 MG PO TBDP: 4.0000 mg | ORAL_TABLET | Freq: Three times a day (TID) | ORAL | 0 refills | Status: DC | PRN

## 2019-06-23 MED ORDER — PROMETHAZINE HCL 25 MG/ML IJ SOLN
12.5000 mg | Freq: Once | INTRAMUSCULAR | Status: AC
  Administered 2019-06-23: 12.5 mg via INTRAVENOUS
  Filled 2019-06-23: qty 1

## 2019-06-23 MED ORDER — OXYCODONE-ACETAMINOPHEN 5-325 MG PO TABS: 1.0000 | ORAL_TABLET | ORAL | 0 refills | Status: DC | PRN

## 2019-06-23 MED ORDER — TAMSULOSIN HCL 0.4 MG PO CAPS: 0.4000 mg | ORAL_CAPSULE | Freq: Every day | ORAL | 0 refills | Status: DC

## 2019-06-23 MED ORDER — HYDROMORPHONE HCL 1 MG/ML IJ SOLN
1.0000 mg | Freq: Once | INTRAMUSCULAR | Status: AC
  Administered 2019-06-23: 1 mg via INTRAVENOUS
  Filled 2019-06-23: qty 1

## 2019-06-23 MED ORDER — SODIUM CHLORIDE 0.9 % IV BOLUS
1000.0000 mL | Freq: Once | INTRAVENOUS | Status: AC
  Administered 2019-06-23: 1000 mL via INTRAVENOUS

## 2019-06-23 MED ORDER — ONDANSETRON HCL 4 MG/2ML IJ SOLN
4.0000 mg | Freq: Once | INTRAMUSCULAR | Status: AC
  Administered 2019-06-23: 4 mg via INTRAVENOUS
  Filled 2019-06-23: qty 2

## 2019-06-23 MED ORDER — KETOROLAC TROMETHAMINE 30 MG/ML IJ SOLN
30.0000 mg | Freq: Once | INTRAMUSCULAR | Status: AC
  Administered 2019-06-23: 30 mg via INTRAVENOUS
  Filled 2019-06-23: qty 1

## 2019-06-23 NOTE — ED Notes (Signed)
EDP at bedside  

## 2019-06-23 NOTE — ED Notes (Signed)
Pt confirms she has a ride, Alfredia Ferguson, will be able to drive her home when discharged.

## 2019-06-23 NOTE — ED Triage Notes (Signed)
Pt states she woke up this morning with left flank pain and emesis.

## 2019-06-23 NOTE — ED Notes (Addendum)
NAD noted at time of D/C. Pt denies questions or concerns. Pt ambulatory to the lobby at this time with friend. Driving precautions reviewed with patient.  Pt refused wheelchair to the lobby. Unable to obtain E-sig at this time due to E-sig pad not working.

## 2019-06-23 NOTE — ED Notes (Signed)
Pt returned from CT at this time.  

## 2019-06-23 NOTE — ED Provider Notes (Signed)
[facility] Emergency Department Provider Note  Time seen: 9:53 AM  I have reviewed the triage vital signs and the nursing notes.   HISTORY  Chief Complaint Flank Pain   HPI Patricia Ray is a 48 y.o. female with a past medical history of endometriosis, hypertension, lupus, prior kidney stone, presents to the emergency department for left flank pain.  According to the patient she was awoken from sleep around 7:00 this morning with significant sharp left flank pain.  Patient states she has been experiencing this pain intermittently over the past few years however it only last several minutes and then goes away.  Today it is been lasting almost 3 hours and is still present.  States nausea and vomiting this morning as well which she relates to the pain.  Denies any dysuria or hematuria although has noted urinary frequency this morning.  Denies any fever cough congestion or shortness of breath.   Past Medical History:  Diagnosis Date  . Arthritis   . Endometriosis   . Hypertension   . Lupus ([place])   . Sleep apnea     Patient Active Problem List   Diagnosis Date Noted  . Primary osteoarthritis of both knees 08/23/2018  . Psoriatic arthritis ([place]) 05/10/2018  . Psoriasis of scalp 05/10/2018  . 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 High risk medication use 05/10/2018  . Essential hypertension 05/10/2018  . Other insomnia 05/10/2018  . Elevated prolactin level ([place]) 05/10/2018  . Vitamin D insufficiency 05/10/2018  . Lupus erythematosus 04/11/2018  . 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 Renal stone 02/21/2016  . Notalgia 02/07/2016  . Increased prolactin level ([place]) 03/19/2015    Past Surgical History:  Procedure Laterality Date  . BREAST BIOPSY Right 04/04/2018   BENIGN EPIDERMAL INCLUSION CYST   . BUNIONECTOMY    . CESAREAN SECTION    . FOOT SURGERY    . REDUCTION MAMMAPLASTY  2005  . TONSILLECTOMY    . TUBAL LIGATION      Prior to Admission  medications   Medication Sig Start Date End Date Taking? Authorizing Provider  cetirizine (ZYRTEC) 10 MG tablet Take 10 mg by mouth as needed for allergies.    [provider]  Cholecalciferol (VITAMIN D-3) 5000 units TABS Take by mouth 2 (two) times daily.     [provider]  Clobetasol Propionate 0.05 % shampoo Apply Topically onto dry scalp once daily in a thin film to affected areas only. Leave in place 15 minutes before lathering and rinsing. 07/09/18   Bo Merino, MD  diphenhydrAMINE HCl (BENADRYL ALLERGY PO) Take by mouth as needed.    [provider]  gentamicin cream (GARAMYCIN) 0.1 % Apply 1 application topically 2 (two) times daily. Patient taking differently: Apply 1 application topically as needed.  11/23/18   Edrick Kins, DPM  hydroquinone 4 % cream as needed. 05/01/18   [provider]  hydroxychloroquine (PLAQUENIL) 200 MG tablet Take 200 mg by mouth 2 (two) times daily.    [provider]  ofloxacin (OCUFLOX) 0.3 % ophthalmic solution ofloxacin 0.3 % eye drops    [provider]  OTEZLA 30 MG TABS TAKE 1 TABLET TWICE A DAY 06/18/19   Bo Merino, MD  Propylene Glycol (SYSTANE COMPLETE OP) Apply to eye as needed.    [provider]  triamterene-hydrochlorothiazide (DYAZIDE) 37.5-25 MG capsule Take 1 capsule by mouth daily.    [provider]    Allergies  Allergen Reactions  . Simvastatin  Other (See Comments)  . Methotrexate Other (See Comments)    Per pt report she was hospitalized for a week due to an allergic reaction after taking methotrexate and simvastatin  . Nickel Swelling    Swelling and rash    Family History  Problem Relation Age of Onset  . Hypercalcemia Mother   . High Cholesterol Mother   . Colon cancer Maternal Grandmother   . Diabetes Father   . Bipolar disorder Sister   . Depression Sister   . Breast cancer Neg Hx     Social History Social History   Tobacco Use   . Smoking status: Current Every Day Smoker    Years: 21.00    Types: Cigarettes  . Smokeless tobacco: Never Used  Substance Use Topics  . Alcohol use: No    Alcohol/week: 0.0 standard drinks  . Drug use: No    Review of Systems Constitutional: Negative for fever. Cardiovascular: Negative for chest pain. Respiratory: Negative for shortness of breath.  Negative for cough. Gastrointestinal: Left flank pain.  Positive for nausea vomiting this morning.  Negative for diarrhea. Genitourinary: Urinary frequency without hematuria or dysuria Musculoskeletal: Negative for musculoskeletal complaints  Neurological: Negative for headache All other ROS negative  ____________________________________________   PHYSICAL EXAM:  VITAL SIGNS: ED Triage Vitals  Enc Vitals Group     BP 06/23/19 0941 (!) 157/107     Pulse Rate 06/23/19 0941 81     Resp 06/23/19 0941 18     Temp 06/23/19 0941 98 F (36.7 C)     Temp Source 06/23/19 0941 Oral     SpO2 06/23/19 0941 100 %     Weight 06/23/19 0937 194 lb 0.1 oz (88 kg)     Height 06/23/19 0937 5' 2.5" (1.588 m)     Head Circumference --      Peak Flow --      Pain Score 06/23/19 0937 10     Pain Loc --      Pain Edu? --      Excl. in [place]? --     Constitutional: Alert and oriented. Well appearing and in no distress. Eyes: Normal exam ENT      Head: Normocephalic and atraumatic.      Mouth/Throat: Mucous membranes are moist. Cardiovascular: Normal rate, regular rhythm. No murmur Respiratory: Normal respiratory effort without tachypnea nor retractions. Breath sounds are clear  Gastrointestinal: Soft and nontender. No distention.   Musculoskeletal: Nontender with normal range of motion in all extremities.  Neurologic:  Normal speech and language. No gross focal neurologic deficits Skin:  Skin is warm, dry and intact.  Psychiatric: Mood and affect are normal.   ____________________________________________    RADIOLOGY  IMPRESSION:  4  mm stone over the proximal left ureter causing low-grade  obstruction.   ____________________________________________   INITIAL IMPRESSION / ASSESSMENT AND PLAN / ED COURSE  Pertinent labs & imaging results that were available during my care of the patient were reviewed by me and considered in my medical decision making (see chart for details).   Patient presents emergency department for left flank pain.  Differential would include ureterolithiasis, UTI, pyelonephritis, colitis or diverticulitis.  Patient has a nontender abdomen history of prior stone to which this feels identical.  We will check labs, CT scan and continue to closely monitor.  We will treat pain nausea and IV hydrate well awaiting results.  Patient agreeable to plan of care.  CT confirms 4 mm left ureteral stone.  We  will continue to treat pain and manage nausea in the emergency department.  Urinalysis shows no obvious infection.  Urine culture has been sent.  As long as we are able to control the patient's pain and nausea we will likely discharge home with urology follow-up.  Patient's pain is controlled down to a 3/10 per patient.  We will discharge home with Percocet, Flomax, urine strainer and urology follow-up.  Patient agreeable to plan of care.  Patricia Ray was evaluated in Emergency Department on 06/23/2019 for the symptoms described in the history of present illness. She was evaluated in the context of the global COVID-19 pandemic, which necessitated consideration that the patient might be at risk for infection with the SARS-CoV-2 virus that causes COVID-19. Institutional protocols and algorithms that pertain to the evaluation of patients at risk for COVID-19 are in a state of rapid change based on information released by regulatory bodies including the CDC and federal and state organizations. These policies and algorithms were followed during the patient's care in the  ED.  ____________________________________________   FINAL CLINICAL IMPRESSION(S) / ED DIAGNOSES  Left flank pain Ureterolithiasis   Harvest Dark, MD 06/23/19 1349

## 2019-06-23 NOTE — ED Notes (Signed)
Pt requesting something else for nausea. Ct at bedside. Md notified.

## 2019-06-24 LAB — URINE CULTURE: Culture: <10000 — AB

## 2019-07-01 ENCOUNTER — Other Ambulatory Visit: Payer: Self-pay | Admitting: Urology

## 2019-07-01 DIAGNOSIS — N201 Calculus of ureter: Secondary | ICD-10-CM

## 2019-07-02 ENCOUNTER — Ambulatory Visit (INDEPENDENT_AMBULATORY_CARE_PROVIDER_SITE_OTHER): Payer: 59 | Admitting: Urology

## 2019-07-02 ENCOUNTER — Ambulatory Visit
Admission: RE | Admit: 2019-07-02 | Discharge: 2019-07-02 | Disposition: A | Discharge status: Home / Self Care | Payer: 59 | Source: Ambulatory Visit | Attending: Urology | Admitting: Urology

## 2019-07-02 ENCOUNTER — Encounter: Payer: Self-pay | Admitting: Urology

## 2019-07-02 ENCOUNTER — Ambulatory Visit
Admission: RE | Admit: 2019-07-02 | Discharge: 2019-07-02 | Disposition: A | Discharge status: Home / Self Care | Payer: 59 | Attending: Urology | Admitting: Urology

## 2019-07-02 ENCOUNTER — Telehealth: Payer: Self-pay

## 2019-07-02 ENCOUNTER — Other Ambulatory Visit: Payer: Self-pay

## 2019-07-02 VITALS — BP 123/84 | HR 90 | Ht 62.5 in | Wt 190.0 lb

## 2019-07-02 DIAGNOSIS — N201 Calculus of ureter: Secondary | ICD-10-CM | POA: Insufficient documentation

## 2019-07-02 DIAGNOSIS — N200 Calculus of kidney: Secondary | ICD-10-CM | POA: Diagnosis not present

## 2019-07-02 NOTE — Progress Notes (Signed)
07/02/19 8:28 AM   Patricia Ray [birth year] [id number]  Referring provider: Sharyne Peach, MD [place],  [place] [address]  CC: Left flank pain  HPI: I saw [name] in urology clinic today for evaluation of left flank pain. She was seen in the ED on 49/13/20 with acute onset of severe [id number] left renal colic and nausea, and CT scan showed a 4 mm left proximal ureteral stone with upstream hydronephrosis.  Urinalysis was non-infected and her labs were benign, and she was discharged with medical expulsive therapy.  She reports her pain resolved 48 hours after being seen in the emergency department.  She thought she caught a stone and brought it today, however there is only a scant amount of mucus in the strainer.  She denies any fevers/chills, flank pain, or dysuria.  She still has some vague bladder pressure.  There are no aggravating or alleviating factors.  Severity is mild.  She reports she had a kidney stone that passed spontaneously during a pregnancy in the past.  She was last seen in urology by our PA Zara Council in April 2017 when a CT was benign aside from a punctate 1 mm left lower pole non-obstructive stone.  She has never required any surgical intervention for stones.  Her past medical history is notable for lupus and arthritis, but she is not on any blood thinners.   PMH: Past Medical History:  Diagnosis Date  . Arthritis   . Endometriosis   . Hypertension   . Lupus ([place])   . Sleep apnea     Surgical History: Past Surgical History:  Procedure Laterality Date  . BREAST BIOPSY Right 04/04/2018   BENIGN EPIDERMAL INCLUSION CYST   . BUNIONECTOMY    . CESAREAN SECTION    . FOOT SURGERY    . REDUCTION MAMMAPLASTY  2005  . TONSILLECTOMY    . TUBAL LIGATION      Allergies:  Allergies  Allergen Reactions  . Simvastatin Other (See Comments)  . Methotrexate Other (See Comments)    Per pt report she was hospitalized for a week due to an allergic  reaction after taking methotrexate and simvastatin  . Nickel Swelling    Swelling and rash    Family History: Family History  Problem Relation Age of Onset  . Hypercalcemia Mother   . High Cholesterol Mother   . Colon cancer Maternal Grandmother   . Diabetes Father   . Bipolar disorder Sister   . Depression Sister   . Breast cancer Neg Hx     Social History:  reports that she has been smoking cigarettes. She has smoked for the past 21.00 years. She has never used smokeless tobacco. She reports that she does not drink alcohol or use drugs.  ROS: Please see flowsheet from today's date for complete review of systems.  Physical Exam: BP 123/84 (BP Location: Left Arm, Patient Position: Sitting, Cuff Size: Large)   Pulse 90   Ht 5' 2.5" (1.588 m)   Wt 190 lb (86.2 kg)   LMP 10/02/2014   BMI 34.20 kg/m    Constitutional:  Alert and oriented, No acute distress. Cardiovascular: Regular rate and rhythm Respiratory: Clear to auscultation bilaterally GI: Abdomen is soft, nontender, nondistended, no abdominal masses GU: No CVA tenderness Lymph: No cervical or inguinal lymphadenopathy. Skin: No rashes, bruises or suspicious lesions. Neurologic: Grossly intact, no focal deficits, moving all 4 extremities. Psychiatric: Normal mood and affect.  Laboratory Data: Reviewed  Pertinent Imaging:  I have personally reviewed the CT stone protocol dated 06/23/2019.  There is a 49 mm left proximal ureteral stone with upstream hydronephrosis, seen on scout CT.  400 HU, 13cm SSD.  Assessment & Plan:   In summary, she is a 48 year old female with a 4 mm proximal left ureteral stone diagnosed on CT on 06/23/2019.  She has not caught a stone, however her pain resolved within 48 hours of her ER visit.  We discussed various treatment options for urolithiasis including observation with or without medical expulsive therapy, shockwave lithotripsy (SWL), ureteroscopy and laser lithotripsy with stent  placement, and percutaneous nephrolithotomy.  We discussed that management is based on stone size, location, density, patient co-morbidities, and patient preference.   Stones <63mm in size have a >80% spontaneous passage rate. Data surrounding the use of tamsulosin for medical expulsive therapy is controversial, but meta analyses suggests it is most efficacious for distal stones between 5-73mm in size. Possible side effects include dizziness/lightheadedness, and retrograde ejaculation.  SWL has a lower stone free rate in a single procedure, but also a lower complication rate compared to ureteroscopy and avoids a stent and associated stent related symptoms. Possible complications include renal hematoma, steinstrasse, and need for additional treatment.  Ureteroscopy with laser lithotripsy and stent placement has a higher stone free rate than SWL in a single procedure, however increased complication rate including possible infection, ureteral injury, bleeding, and stent related morbidity. Common stent related symptoms include dysuria, urgency/frequency, and flank pain.  I recommended obtaining a KUB today to confirm passage of her stone.  If her stone is still present, she would like to pursue shockwave lithotripsy for treatment.  Call with KUB results today  Billey Co, MD  [facility] [address] [place], [place] [address] [phone]

## 2019-07-02 NOTE — Patient Instructions (Signed)
Lithotripsy  Lithotripsy is a treatment that can sometimes help eliminate kidney stones and the pain that they cause. A form of lithotripsy, also known as extracorporeal shock wave lithotripsy, is a nonsurgical procedure that crushes a kidney stone with shock waves. These shock waves pass through your body and focus on the kidney stone. They cause the kidney stone to break up while it is still in the urinary tract. This makes it easier for the smaller pieces of stone to pass in the urine. Tell a health care provider about:  Any allergies you have.  All medicines you are taking, including vitamins, herbs, eye drops, creams, and over-the-counter medicines.  Any blood disorders you have.  Any surgeries you have had.  Any medical conditions you have.  Whether you are pregnant or may be pregnant.  Any problems you or family members have had with anesthetic medicines. What are the risks? Generally, this is a safe procedure. However, problems may occur, including:  Infection.  Bleeding of the kidney.  Bruising of the kidney or skin.  Scarring of the kidney, which can lead to: ? Increased blood pressure. ? Poor kidney function. ? Return (recurrence) of kidney stones.  Damage to other structures or organs, such as the liver, colon, spleen, or pancreas.  Blockage (obstruction) of the the tube that carries urine from the kidney to the bladder (ureter).  Failure of the kidney stone to break into pieces (fragments). What happens before the procedure? Staying hydrated Follow instructions from your health care provider about hydration, which may include:  Up to 2 hours before the procedure - you may continue to drink clear liquids, such as water, clear fruit juice, black coffee, and plain tea. Eating and drinking restrictions Follow instructions from your health care provider about eating and drinking, which may include:  8 hours before the procedure - stop eating heavy meals or foods  such as meat, fried foods, or fatty foods.  6 hours before the procedure - stop eating light meals or foods, such as toast or cereal.  6 hours before the procedure - stop drinking milk or drinks that contain milk.  2 hours before the procedure - stop drinking clear liquids. General instructions  Plan to have someone take you home from the hospital or clinic.  Ask your health care provider about: ? Changing or stopping your regular medicines. This is especially important if you are taking diabetes medicines or blood thinners. ? Taking medicines such as aspirin and ibuprofen. These medicines and other NSAIDs can thin your blood. Do not take these medicines for 7 days before your procedure if your health care provider instructs you not to.  You may have tests, such as: ? Blood tests. ? Urine tests. ? Imaging tests, such as a CT scan. What happens during the procedure?  To lower your risk of infection: ? Your health care team will wash or sanitize their hands. ? Your skin will be washed with soap.  An IV tube will be inserted into one of your veins. This tube will give you fluids and medicines.  You will be given one or more of the following: ? A medicine to help you relax (sedative). ? A medicine to make you fall asleep (general anesthetic).  A water-filled cushion may be placed behind your kidney or on your abdomen. In some cases you may be placed in a tub of lukewarm water.  Your body will be positioned in a way that makes it easy to target the kidney   stone.  A flexible tube with holes in it (stent) may be placed in the ureter. This will help keep urine flowing from the kidney if the fragments of the stone have been blocking the ureter.  An X-ray or ultrasound exam will be done to locate your stone.  Shock waves will be aimed at the stone. If you are awake, you may feel a tapping sensation as the shock waves pass through your body. The procedure may vary among health care  providers and hospitals. What happens after the procedure?  You may have an X-ray to see whether the procedure was able to break up the kidney stone and how much of the stone has passed. If large stone fragments remain after treatment, you may need to have a second procedure at a later time.  Your blood pressure, heart rate, breathing rate, and blood oxygen level will be monitored until the medicines you were given have worn off.  You may be given antibiotics or pain medicine as needed.  If a stent was placed in your ureter during surgery, it may stay in place for a few weeks.  You may need strain your urine to collect pieces of the kidney stone for testing.  You will need to drink plenty of water.  Do not drive for 24 hours if you were given a sedative. Summary  Lithotripsy is a treatment that can sometimes help eliminate kidney stones and the pain that they cause.  A form of lithotripsy, also known as extracorporeal shock wave lithotripsy, is a nonsurgical procedure that crushes a kidney stone with shock waves.  Generally, this is a safe procedure. However, problems may occur, including damage to the kidney or other organs, infection, or obstruction of the tube that carries urine from the kidney to the bladder (ureter).  When you go home, you will need to drink plenty of water. You may be asked to strain your urine to collect pieces of the kidney stone for testing. This information is not intended to replace advice given to you by your health care provider. Make sure you discuss any questions you have with your health care provider. Document Released: 09/23/2000 Document Revised: 01/07/2019 Document Reviewed: 08/17/2016 Elsevier Patient Education  2020 [employer].   Dietary Guidelines to Help Prevent Kidney Stones Kidney stones are deposits of minerals and salts that form inside your kidneys. Your risk of developing kidney stones may be greater depending on your diet, your  lifestyle, the medicines you take, and whether you have certain medical conditions. Most people can reduce their chances of developing kidney stones by following the instructions below. Depending on your overall health and the type of kidney stones you tend to develop, your dietitian may give you more specific instructions. What are tips for following this plan? Reading food labels  Choose foods with "no salt added" or "low-salt" labels. Limit your sodium intake to less than 1500 mg per day.  Choose foods with calcium for each meal and snack. Try to eat about 300 mg of calcium at each meal. Foods that contain 200-500 mg of calcium per serving include: ? 8 oz (237 ml) of milk, fortified nondairy milk, and fortified fruit juice. ? 8 oz (237 ml) of kefir, yogurt, and soy yogurt. ? 4 oz (118 ml) of tofu. ? 1 oz of cheese. ? 1 cup (300 g) of dried figs. ? 1 cup (91 g) of cooked broccoli. ? 1-3 oz can of sardines or mackerel.  Most people need 1000 to  1500 mg of calcium each day. Talk to your dietitian about how much calcium is recommended for you. Shopping  Buy plenty of fresh fruits and vegetables. Most people do not need to avoid fruits and vegetables, even if they contain nutrients that may contribute to kidney stones.  When shopping for convenience foods, choose: ? Whole pieces of fruit. ? Premade salads with dressing on the side. ? Low-fat fruit and yogurt smoothies.  Avoid buying frozen meals or prepared deli foods.  Look for foods with live cultures, such as yogurt and kefir. Cooking  Do not add salt to food when cooking. Place a salt shaker on the table and allow each person to add his or her own salt to taste.  Use vegetable protein, such as beans, textured vegetable protein (TVP), or tofu instead of meat in pasta, casseroles, and soups. Meal planning   Eat less salt, if told by your dietitian. To do this: ? Avoid eating processed or premade food. ? Avoid eating fast food.   Eat less animal protein, including cheese, meat, poultry, or fish, if told by your dietitian. To do this: ? Limit the number of times you have meat, poultry, fish, or cheese each week. Eat a diet free of meat at least 2 days a week. ? Eat only one serving each day of meat, poultry, fish, or seafood. ? When you prepare animal protein, cut pieces into small portion sizes. For most meat and fish, one serving is about the size of one deck of cards.  Eat at least 5 servings of fresh fruits and vegetables each day. To do this: ? Keep fruits and vegetables on hand for snacks. ? Eat 1 piece of fruit or a handful of berries with breakfast. ? Have a salad and fruit at lunch. ? Have two kinds of vegetables at dinner.  Limit foods that are high in a substance called oxalate. These include: ? Spinach. ? Rhubarb. ? Beets. ? Potato chips and french fries. ? Nuts.  If you regularly take a diuretic medicine, make sure to eat at least 1-2 fruits or vegetables high in potassium each day. These include: ? Avocado. ? Banana. ? Orange, prune, carrot, or tomato juice. ? Baked potato. ? Cabbage. ? Beans and split peas. General instructions   Drink enough fluid to keep your urine clear or pale yellow. This is the most important thing you can do.  Talk to your health care provider and dietitian about taking daily supplements. Depending on your health and the cause of your kidney stones, you may be advised: ? Not to take supplements with vitamin C. ? To take a calcium supplement. ? To take a daily probiotic supplement. ? To take other supplements such as magnesium, fish oil, or vitamin B6.  Take all medicines and supplements as told by your health care provider.  Limit alcohol intake to no more than 1 drink a day for nonpregnant women and 2 drinks a day for men. One drink equals 12 oz of beer, 5 oz of wine, or 1 oz of hard liquor.  Lose weight if told by your health care provider. Work with your  dietitian to find strategies and an eating plan that works best for you. What foods are not recommended? Limit your intake of the following foods, or as told by your dietitian. Talk to your dietitian about specific foods you should avoid based on the type of kidney stones and your overall health. Grains Breads. Bagels. Rolls. Baked goods. Salted crackers.  Cereal. Pasta. Vegetables Spinach. Rhubarb. Beets. Canned vegetables. Angie Fava. Olives. Meats and other protein foods Nuts. Nut butters. Large portions of meat, poultry, or fish. Salted or cured meats. Deli meats. Hot dogs. Sausages. Dairy Cheese. Beverages Regular soft drinks. Regular vegetable juice. Seasonings and other foods Seasoning blends with salt. Salad dressings. Canned soups. Soy sauce. Ketchup. Barbecue sauce. Canned pasta sauce. Casseroles. Pizza. Lasagna. Frozen meals. Potato chips. [place] fries. Summary  You can reduce your risk of kidney stones by making changes to your diet.  The most important thing you can do is drink enough fluid. You should drink enough fluid to keep your urine clear or pale yellow.  Ask your health care provider or dietitian how much protein from animal sources you should eat each day, and also how much salt and calcium you should have each day. This information is not intended to replace advice given to you by your health care provider. Make sure you discuss any questions you have with your health care provider. Document Released: 01/21/2011 Document Revised: 01/16/2019 Document Reviewed: 09/06/2016 Elsevier Patient Education  2020 [employer].

## 2019-07-02 NOTE — Telephone Encounter (Signed)
Called pt informed her of the information below, pt gave verbal understanding. Pt questions still having some "bladder pressure" advised pt on adequate hydration, also advised pt to give body some time to heal as she has just passed a stone and this could be residual pain/pressure from that. Advised pt to f/u with PCP or [place] if sx not resolved in a few weeks. Pt gave verbal understanding.

## 2019-07-12 ENCOUNTER — Emergency Department: Payer: 59

## 2019-07-12 ENCOUNTER — Other Ambulatory Visit: Payer: Self-pay

## 2019-07-12 ENCOUNTER — Emergency Department
Admission: EM | Admit: 2019-07-12 | Discharge: 2019-07-12 | Disposition: A | Discharge status: Home / Self Care | Payer: 59 | Attending: Emergency Medicine | Admitting: Emergency Medicine

## 2019-07-12 ENCOUNTER — Telehealth: Payer: Self-pay | Admitting: Urology

## 2019-07-12 DIAGNOSIS — N130 Hydronephrosis with ureteropelvic junction obstruction: Secondary | ICD-10-CM | POA: Diagnosis not present

## 2019-07-12 DIAGNOSIS — Z79899 Other long term (current) drug therapy: Secondary | ICD-10-CM | POA: Insufficient documentation

## 2019-07-12 DIAGNOSIS — N201 Calculus of ureter: Secondary | ICD-10-CM

## 2019-07-12 DIAGNOSIS — F17210 Nicotine dependence, cigarettes, uncomplicated: Secondary | ICD-10-CM | POA: Diagnosis not present

## 2019-07-12 DIAGNOSIS — I10 Essential (primary) hypertension: Secondary | ICD-10-CM | POA: Diagnosis not present

## 2019-07-12 DIAGNOSIS — R1032 Left lower quadrant pain: Secondary | ICD-10-CM | POA: Diagnosis present

## 2019-07-12 LAB — CBC
HCT: 46.6 % — ABNORMAL HIGH (ref 36.0–46.0)
Hemoglobin: 15.6 g/dL — ABNORMAL HIGH (ref 12.0–15.0)
MCH: 28.8 pg (ref 26.0–34.0)
MCHC: 33.5 g/dL (ref 30.0–36.0)
MCV: 86.1 fL (ref 80.0–100.0)
Platelets: 310 10*3/uL (ref 150–400)
RBC: 5.41 MIL/uL — ABNORMAL HIGH (ref 3.87–5.11)
RDW: 14.5 % (ref 11.5–15.5)
WBC: 12.4 10*3/uL — ABNORMAL HIGH (ref 4.0–10.5)
nRBC: 0 % (ref 0.0–0.2)

## 2019-07-12 LAB — LIPASE, BLOOD: Lipase: 33 U/L (ref 11–51)

## 2019-07-12 LAB — URINALYSIS, COMPLETE (UACMP) WITH MICROSCOPIC
Bilirubin Urine: NEGATIVE
Glucose, UA: NEGATIVE mg/dL
Hgb urine dipstick: NEGATIVE
Ketones, ur: NEGATIVE mg/dL
Leukocytes,Ua: NEGATIVE
Nitrite: NEGATIVE
Protein, ur: NEGATIVE mg/dL
Specific Gravity, Urine: 1.031 — ABNORMAL HIGH (ref 1.005–1.030)
pH: 5 (ref 5.0–8.0)

## 2019-07-12 LAB — COMPREHENSIVE METABOLIC PANEL
ALT: 16 U/L (ref 0–44)
AST: 19 U/L (ref 15–41)
Albumin: 4.3 g/dL (ref 3.5–5.0)
Alkaline Phosphatase: 55 U/L (ref 38–126)
Anion gap: 9 (ref 5–15)
BUN: 14 mg/dL (ref 6–20)
CO2: 24 mmol/L (ref 22–32)
Calcium: 9.9 mg/dL (ref 8.9–10.3)
Chloride: 108 mmol/L (ref 98–111)
Creatinine, Ser: 0.86 mg/dL (ref 0.44–1.00)
GFR calc Af Amer: >60 mL/min (ref >60)
GFR calc non Af Amer: >60 mL/min (ref >60)
Glucose, Bld: 114 mg/dL — ABNORMAL HIGH (ref 70–99)
Potassium: 3.4 mmol/L — ABNORMAL LOW (ref 3.5–5.1)
Sodium: 141 mmol/L (ref 135–145)
Total Bilirubin: 0.7 mg/dL (ref 0.3–1.2)
Total Protein: 7.5 g/dL (ref 6.5–8.1)

## 2019-07-12 MED ORDER — TAMSULOSIN HCL 0.4 MG PO CAPS: 0.4000 mg | ORAL_CAPSULE | Freq: Every day | ORAL | 0 refills | Status: AC

## 2019-07-12 MED ORDER — SODIUM CHLORIDE 0.9 % IV BOLUS
1000.0000 mL | Freq: Once | INTRAVENOUS | Status: AC
  Administered 2019-07-12: 1000 mL via INTRAVENOUS

## 2019-07-12 MED ORDER — MORPHINE SULFATE (PF) 4 MG/ML IV SOLN
4.0000 mg | Freq: Once | INTRAVENOUS | Status: AC
  Administered 2019-07-12: 4 mg via INTRAVENOUS
  Filled 2019-07-12: qty 1

## 2019-07-12 MED ORDER — ONDANSETRON HCL 4 MG/2ML IJ SOLN
4.0000 mg | Freq: Once | INTRAMUSCULAR | Status: AC
  Administered 2019-07-12: 4 mg via INTRAVENOUS
  Filled 2019-07-12: qty 2

## 2019-07-12 MED ORDER — TRAMADOL HCL 50 MG PO TABS: 50.0000 mg | ORAL_TABLET | Freq: Four times a day (QID) | ORAL | 0 refills | Status: AC | PRN

## 2019-07-12 MED ORDER — KETOROLAC TROMETHAMINE 30 MG/ML IJ SOLN
30.0000 mg | Freq: Once | INTRAMUSCULAR | Status: AC
  Administered 2019-07-12: 30 mg via INTRAVENOUS
  Filled 2019-07-12: qty 1

## 2019-07-12 NOTE — Telephone Encounter (Signed)
Her stone has moved all the way down and has almost passed into the bladder on her CT from today. Continue flomax, please set up a visit with me or Sam on Monday to discuss surgery vs ongoing expulsive therapy. Continue flomax and strain urine, she has a high likelihood of passing the stone this weekend.  Nickolas Madrid, MD 07/12/2019

## 2019-07-12 NOTE — ED Provider Notes (Signed)
[facility] Emergency Department Provider Note   First MD Initiated Contact with Patient 07/12/19 [phone]     (approximate)  I have reviewed the triage vital signs and the nursing notes.   HISTORY  Chief Complaint Abdominal Pain    HPI Patricia Ray is a 48 y.o. female with below list of previous medical conditions including hypertension endometriosis lupus and kidney stone diagnosed on 06/23/2019 returns to the emergency department secondary to 10 out of 10 left lower quadrant abdominal pain with associated nausea.  Patient states that symptoms consistent with previous pain secondary to kidney stone.  Patient denies any fever.  Patient denies any hematuria or dysuria.  Patient however states that she has noticed a decreased stream of her urine.  Patient states that she was seen by the urologist after her ED visit, at which point a x-ray was performed which did not reveal any evidence of a stone and as such she was told that she passed her stone".     Past Medical History:  Diagnosis Date  . Arthritis   . Endometriosis   . Hypertension   . Lupus ([place])   . Sleep apnea     Patient Active Problem List   Diagnosis Date Noted  . OSA (obstructive sleep apnea) 10/30/2018  . Primary osteoarthritis of both knees 08/23/2018  . Psoriatic arthritis ([place]) 05/10/2018  . Psoriasis of scalp 05/10/2018  . 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 High risk medication use 05/10/2018  . Essential hypertension 05/10/2018  . Other insomnia 05/10/2018  . Elevated prolactin level 05/10/2018  . Vitamin D insufficiency 05/10/2018  . Lupus erythematosus 04/11/2018  . 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 Renal stone 02/21/2016  . Notalgia 02/07/2016  . Increased prolactin level 03/19/2015    Past Surgical History:  Procedure Laterality Date  . BREAST BIOPSY Right 04/04/2018   BENIGN EPIDERMAL INCLUSION CYST   . BUNIONECTOMY    . CESAREAN SECTION    . FOOT SURGERY     . REDUCTION MAMMAPLASTY  2005  . TONSILLECTOMY    . TUBAL LIGATION      Prior to Admission medications   Medication Sig Start Date End Date Taking? Authorizing Provider  cetirizine (ZYRTEC) 10 MG tablet Take 10 mg by mouth as needed for allergies.    [provider]  Cholecalciferol (VITAMIN D-3) 5000 units TABS Take by mouth 2 (two) times daily.     [provider]  Clobetasol Propionate 0.05 % shampoo Apply Topically onto dry scalp once daily in a thin film to affected areas only. Leave in place 15 minutes before lathering and rinsing. 07/09/18   Bo Merino, MD  diphenhydrAMINE HCl (BENADRYL ALLERGY PO) Take by mouth as needed.    [provider]  gentamicin cream (GARAMYCIN) 0.1 % Apply 1 application topically 2 (two) times daily. Patient taking differently: Apply 1 application topically as needed.  11/23/18   Edrick Kins, DPM  hydroquinone 4 % cream as needed. 05/01/18   [provider]  hydroxychloroquine (PLAQUENIL) 200 MG tablet Take 200 mg by mouth 2 (two) times daily.    [provider]  ofloxacin (OCUFLOX) 0.3 % ophthalmic solution ofloxacin 0.3 % eye drops    [provider]  ondansetron (ZOFRAN ODT) 4 MG disintegrating tablet Take 1 tablet (4 mg total) by mouth every 8 (eight) hours as needed for nausea or vomiting. 06/23/19   Harvest Dark, MD  OTEZLA 30 MG TABS TAKE 1 TABLET TWICE A DAY 06/18/19  Bo Merino, MD  oxyCODONE-acetaminophen (PERCOCET) 5-325 MG tablet Take 1 tablet by mouth every 4 (four) hours as needed for severe pain. 06/23/19   Harvest Dark, MD  Propylene Glycol (SYSTANE COMPLETE OP) Apply to eye as needed.    [provider]  tamsulosin (FLOMAX) 0.4 MG CAPS capsule Take 1 capsule (0.4 mg total) by mouth daily. 06/23/19   Harvest Dark, MD  triamterene-hydrochlorothiazide (DYAZIDE) 37.5-25 MG capsule Take 1 capsule by mouth daily.    [provider]    Allergies  Simvastatin, Methotrexate, and Nickel  Family History  Problem Relation Age of Onset  . Hypercalcemia Mother   . High Cholesterol Mother   . Colon cancer Maternal Grandmother   . Diabetes Father   . Bipolar disorder Sister   . Depression Sister   . Breast cancer Neg Hx     Social History Social History   Tobacco Use  . Smoking status: Current Every Day Smoker    Years: 21.00    Types: Cigarettes  . Smokeless tobacco: Never Used  Substance Use Topics  . Alcohol use: No    Alcohol/week: 0.0 standard drinks  . Drug use: No    Review of Systems Constitutional: No fever/chills Eyes: No visual changes. ENT: No sore throat. Cardiovascular: Denies chest pain. Respiratory: Denies shortness of breath. Gastrointestinal: Positive for abdominal pain and nausea., no vomiting.  No diarrhea.  No constipation. Genitourinary: Negative for dysuria. Musculoskeletal: Negative for neck pain.  Negative for back pain. Integumentary: Negative for rash. Neurological: Negative for headaches, focal weakness or numbness.  ____________________________________________   PHYSICAL EXAM:  VITAL SIGNS: ED Triage Vitals  Enc Vitals Group     BP 07/12/19 0511 (!) 158/99     Pulse Rate 07/12/19 0511 (!) 107     Resp 07/12/19 0511 18     Temp 07/12/19 0511 98.2 F (36.8 C)     Temp src --      SpO2 07/12/19 0511 97 %     Weight 07/12/19 0513 86.2 kg (190 lb)     Height 07/12/19 0513 1.588 m (5' 2.5")     Head Circumference --      Peak Flow --      Pain Score 07/12/19 0512 10     Pain Loc --      Pain Edu? --      Excl. in [place]? --     Constitutional: Alert and oriented.  Apparent discomfort Eyes: Conjunctivae are normal.  Mouth/Throat: Mucous membranes are moist. Neck: No stridor.  No meningeal signs.   Cardiovascular: Normal rate, regular rhythm. Good peripheral circulation. Grossly normal heart sounds. Respiratory: Normal respiratory effort.  No retractions. Gastrointestinal: Soft  and nontender. No distention.  Musculoskeletal: No lower extremity tenderness nor edema. No gross deformities of extremities. Neurologic:  Normal speech and language. No gross focal neurologic deficits are appreciated.  Skin:  Skin is warm, dry and intact. Psychiatric: Mood and affect are normal. Speech and behavior are normal.  ____________________________________________   LABS (all labs ordered are listed, but only abnormal results are displayed)  Labs Reviewed  LIPASE, BLOOD  COMPREHENSIVE METABOLIC PANEL  CBC  URINALYSIS, COMPLETE (UACMP) WITH MICROSCOPIC     Procedures   ____________________________________________   INITIAL IMPRESSION / MDM / [place] / ED COURSE  As part of my medical decision making, I reviewed the following data within the electronic MEDICAL RECORD NUMBER  48 year old female presented with above-stated history and physical exam secondary to left lower  quadrant abdominal pain concerning for possible ureterolithiasis.  CT revealed a 4 x 2 mm left UVJ stone.  Patient received IV morphine 4 mg and Zofran with improvement of pain.  After review of CT and laboratory data patient given Toradol 30 mg IV ____________________________________________  FINAL CLINICAL IMPRESSION(S) / ED DIAGNOSES  Final diagnoses:  Ureterolithiasis     MEDICATIONS GIVEN DURING THIS VISIT:  Medications  morphine 4 MG/ML injection 4 mg (4 mg Intravenous Given 07/12/19 0550)  ondansetron (ZOFRAN) injection 4 mg (4 mg Intravenous Given 07/12/19 0551)     ED Discharge Orders    None      *Please note:  Anhelica Grabiec was evaluated in Emergency Department on 07/12/2019 for the symptoms described in the history of present illness. She was evaluated in the context of the global COVID-19 pandemic, which necessitated consideration that the patient might be at risk for infection with the SARS-CoV-2 virus that causes COVID-19. Institutional protocols and algorithms that  pertain to the evaluation of patients at risk for COVID-19 are in a state of rapid change based on information released by regulatory bodies including the CDC and federal and state organizations. These policies and algorithms were followed during the patient's care in the ED.  Some ED evaluations and interventions may be delayed as a result of limited staffing during the pandemic.*  Note:  This document was prepared using Dragon voice recognition software and may include unintentional dictation errors.   Gregor Hams, MD 07/12/19 [phone]

## 2019-07-12 NOTE — Telephone Encounter (Signed)
You saw this patient on 9/22 do you want he rto make an appointment. Usually 69mm stones can be passed. Please advise

## 2019-07-12 NOTE — ED Triage Notes (Signed)
Patient c/o lower left abdominal pain, decreased force of urinary stream, dysuria.

## 2019-07-12 NOTE — ED Notes (Signed)
Patient back from CT.

## 2019-07-12 NOTE — Telephone Encounter (Signed)
Spoke to patient and she is coming to come pick up a strainer.

## 2019-07-12 NOTE — ED Notes (Addendum)
Patient to CT. Blue, Red, LT [place], [place], [employer], and urine sent to lab.

## 2019-07-12 NOTE — ED Notes (Signed)
Pt gathered all personal belongings from room and removed them prior to ED departure  

## 2019-07-12 NOTE — ED Notes (Signed)
Pt st she wanted some medication to "help push the kidney stone out". Pt was educated on the importance of increasing her fluid intake and MD was notified of pts request.

## 2019-07-12 NOTE — Telephone Encounter (Signed)
I called pt to make her an ER follow up appt for a kidney stone and she refused the appt and states that she wants someone to call her to schedule surgery for the kidney stone to be removed.She has a 4X2 mm stone seen on CT. Please advise.

## 2019-07-15 ENCOUNTER — Ambulatory Visit: Payer: 59 | Admitting: Urology

## 2019-07-15 ENCOUNTER — Telehealth (INDEPENDENT_AMBULATORY_CARE_PROVIDER_SITE_OTHER): Payer: 59 | Admitting: Urology

## 2019-07-15 ENCOUNTER — Telehealth: Payer: Self-pay | Admitting: Urology

## 2019-07-15 ENCOUNTER — Other Ambulatory Visit: Payer: Self-pay

## 2019-07-15 ENCOUNTER — Other Ambulatory Visit: Payer: Self-pay | Admitting: Radiology

## 2019-07-15 DIAGNOSIS — N201 Calculus of ureter: Secondary | ICD-10-CM

## 2019-07-15 NOTE — Progress Notes (Signed)
UROLOGY TELEPHONE NOTE  I had a long conversation over the phone with [name] today.  To briefly summarize she is a 48 year old female that I originally saw on 07/02/19 for ER follow-up after she presented to the ER on 06/23/2019 with a 4 mm left proximal ureteral stone and flank pain.  At our visit on 9/22 her pain had completely resolved, and no stone was clearly seen on KUB.  She re-presented to the ER on 07/12/2019 with acute new left flank pain and pressure.  A CT was performed and showed a 4 mm left distal ureteral stone with some upstream hydronephrosis.  She had no clinical or laboratory signs of infection, and was discharged on medical expulsive therapy with Flomax.  Stone measures 400HU, pH of urine is 5, and stone is likely uric acid and therefore unable to be seen on KUB.  I had a long conversation with the patient about options including ongoing medical expulsive therapy with greater than 80% chance of spontaneous passage, ureteroscopy, laser lithotripsy, and stent placement as soon as tomorrow versus next week.  She is very frustrated with her situation.  She would like to tentatively schedule ureteroscopy/laser lithotripsy next Friday, and will continue to strain her urine and will cancel if she spontaneously passes her stone.  We discussed return precautions including uncontrolled pain or fever over 101.3.  Nickolas Madrid, MD 07/15/2019

## 2019-07-15 NOTE — Telephone Encounter (Signed)
Discussed in detail Dr. Doristine Counter note stone is probably uric acid but with out stone analysis cannot confirm. Patient is still questioning having surgery this week and states that she will call back if she has more questions

## 2019-07-15 NOTE — Telephone Encounter (Signed)
Pt had virtual w/Sninsky this morning.  She wants to know why Sninsky couldn't tell what type of kidney stone she has by CT scan.  She forgot to ask that on virtual this morning.  She is having second thoughts about surgery this Friday.

## 2019-07-16 ENCOUNTER — Other Ambulatory Visit: Payer: Self-pay

## 2019-07-16 ENCOUNTER — Encounter
Admission: RE | Admit: 2019-07-16 | Discharge: 2019-07-16 | Disposition: A | Discharge status: Home / Self Care | Payer: 59 | Source: Ambulatory Visit | Attending: Urology | Admitting: Urology

## 2019-07-16 DIAGNOSIS — Z20828 Contact with and (suspected) exposure to other viral communicable diseases: Secondary | ICD-10-CM | POA: Insufficient documentation

## 2019-07-16 DIAGNOSIS — Z01810 Encounter for preprocedural cardiovascular examination: Secondary | ICD-10-CM | POA: Diagnosis not present

## 2019-07-16 DIAGNOSIS — I10 Essential (primary) hypertension: Secondary | ICD-10-CM | POA: Diagnosis not present

## 2019-07-16 DIAGNOSIS — Z01818 Encounter for other preprocedural examination: Secondary | ICD-10-CM | POA: Insufficient documentation

## 2019-07-16 HISTORY — DX: Gastro-esophageal reflux disease without esophagitis: K21.9

## 2019-07-16 HISTORY — DX: Personal history of urinary calculi: Z87.442

## 2019-07-16 HISTORY — DX: Psoriasis, unspecified: L40.9

## 2019-07-16 LAB — SARS CORONAVIRUS 2 (TAT 6-24 HRS): SARS Coronavirus 2: NEGATIVE

## 2019-07-16 NOTE — Patient Instructions (Signed)
Your procedure is scheduled on: Friday 10/9 Report to Day Surgery. To find out your arrival time please call [phone] between 1PM - 3PM on Thurs 10/8.  Remember: Instructions that are not followed completely may result in serious medical risk,  up to and including death, or upon the discretion of your surgeon and anesthesiologist your  surgery may need to be rescheduled.     _X__ 1. Do not eat food after midnight the night before your procedure.                 No gum chewing or hard candies. You may drink clear liquids up to 2 hours                 before you are scheduled to arrive for your surgery- DO not drink clear                 liquids within 2 hours of the start of your surgery.                 Clear Liquids include:  water, apple juice without pulp, clear carbohydrate                 drink such as Clearfast of Gatorade, Black Coffee or Tea (Do not add                 anything to coffee or tea).  __X__2.  On the morning of surgery brush your teeth with toothpaste and water, you                may rinse your mouth with mouthwash if you wish.  Do not swallow any toothpaste of mouthwash.     _X__ 3.  No Alcohol for 24 hours before or after surgery.   _X__ 4.  Do Not Smoke or use e-cigarettes For 24 Hours Prior to Your Surgery.                 Do not use any chewable tobacco products for at least 6 hours prior to                 surgery.  ____  5.  Bring all medications with you on the day of surgery if instructed.   _x___  6.  Notify your doctor if there is any change in your medical condition      (cold, fever, infections).     Do not wear jewelry, make-up, hairpins, clips or nail polish. Do not wear lotions, powders, or perfumes. You may wear deodorant. Do not shave 48 hours prior to surgery. Men may shave face and neck. Do not bring valuables to the hospital.    [facility] is not responsible for any belongings or valuables.  Contacts,  dentures or bridgework may not be worn into surgery. Leave your suitcase in the car. After surgery it may be brought to your room. For patients admitted to the hospital, discharge time is determined by your treatment team.   Patients discharged the day of surgery will not be allowed to drive home.   Please read over the following fact sheets that you were given:     __x__ Take these medicines the morning of surgery with A SIP OF WATER:    1. hydroxychloroquine (PLAQUENIL) 200 MG tablet  2. OTEZLA 30 MG TABS  3. traMADol (ULTRAM) 50 MG tablet  4.tamsulosin (FLOMAX) 0.4 MG CAPS capsule  5.  6.  ____ Fleet Enema (as  directed)   ____ Use CHG Soap as directed  ____ Use inhalers on the day of surgery  ____ Stop metformin 2 days prior to surgery    ____ Take 1/2 of usual insulin dose the night before surgery. No insulin the morning          of surgery.   ____ Stop Coumadin/Plavix/aspirin on   ____ Stop Anti-inflammatories on   ____ Stop supplements until after surgery.    _x___ Bring C-Pap to the hospital.

## 2019-07-18 MED ORDER — CEFAZOLIN SODIUM-DEXTROSE 2-4 GM/100ML-% IV SOLN
2.0000 g | INTRAVENOUS | Status: AC
  Administered 2019-07-19: 2 g via INTRAVENOUS

## 2019-07-19 ENCOUNTER — Ambulatory Visit: Payer: 59 | Admitting: Certified Registered"

## 2019-07-19 ENCOUNTER — Encounter
Admission: RE | Disposition: A | Discharge status: Home / Self Care | Payer: Self-pay | Source: Home / Self Care | Attending: Urology

## 2019-07-19 ENCOUNTER — Encounter: Payer: Self-pay | Admitting: *Deleted

## 2019-07-19 ENCOUNTER — Ambulatory Visit
Admission: RE | Admit: 2019-07-19 | Discharge: 2019-07-19 | Disposition: A | Discharge status: Home / Self Care | Payer: 59 | Attending: Urology | Admitting: Urology

## 2019-07-19 DIAGNOSIS — I10 Essential (primary) hypertension: Secondary | ICD-10-CM | POA: Insufficient documentation

## 2019-07-19 DIAGNOSIS — G4730 Sleep apnea, unspecified: Secondary | ICD-10-CM | POA: Insufficient documentation

## 2019-07-19 DIAGNOSIS — Z87442 Personal history of urinary calculi: Secondary | ICD-10-CM | POA: Insufficient documentation

## 2019-07-19 DIAGNOSIS — N201 Calculus of ureter: Secondary | ICD-10-CM | POA: Diagnosis present

## 2019-07-19 DIAGNOSIS — L4050 Arthropathic psoriasis, unspecified: Secondary | ICD-10-CM | POA: Diagnosis not present

## 2019-07-19 DIAGNOSIS — Z79899 Other long term (current) drug therapy: Secondary | ICD-10-CM | POA: Insufficient documentation

## 2019-07-19 DIAGNOSIS — F17210 Nicotine dependence, cigarettes, uncomplicated: Secondary | ICD-10-CM | POA: Insufficient documentation

## 2019-07-19 HISTORY — PX: CYSTOSCOPY/URETEROSCOPY/HOLMIUM LASER/STENT PLACEMENT: SHX6546

## 2019-07-19 SURGERY — CYSTOSCOPY/URETEROSCOPY/HOLMIUM LASER/STENT PLACEMENT
Anesthesia: General | Laterality: Left

## 2019-07-19 MED ORDER — SUGAMMADEX SODIUM 200 MG/2ML IV SOLN
INTRAVENOUS | Status: AC
  Filled 2019-07-19: qty 2

## 2019-07-19 MED ORDER — SUGAMMADEX SODIUM 200 MG/2ML IV SOLN
INTRAVENOUS | Status: DC | PRN
  Administered 2019-07-19: 180 mg via INTRAVENOUS

## 2019-07-19 MED ORDER — ONDANSETRON HCL 4 MG/2ML IJ SOLN: 4.0000 mg | Freq: Once | INTRAMUSCULAR | Status: DC | PRN

## 2019-07-19 MED ORDER — FENTANYL CITRATE (PF) 100 MCG/2ML IJ SOLN
25.0000 ug | INTRAMUSCULAR | Status: DC | PRN
  Administered 2019-07-19: 12:00:00 25 ug via INTRAVENOUS

## 2019-07-19 MED ORDER — DEXAMETHASONE SODIUM PHOSPHATE 10 MG/ML IJ SOLN
INTRAMUSCULAR | Status: AC
  Filled 2019-07-19: qty 1

## 2019-07-19 MED ORDER — ROCURONIUM BROMIDE 100 MG/10ML IV SOLN
INTRAVENOUS | Status: DC | PRN
  Administered 2019-07-19: 30 mg via INTRAVENOUS

## 2019-07-19 MED ORDER — KETOROLAC TROMETHAMINE 30 MG/ML IJ SOLN
INTRAMUSCULAR | Status: AC
  Filled 2019-07-19: qty 1

## 2019-07-19 MED ORDER — PROPOFOL 10 MG/ML IV BOLUS
INTRAVENOUS | Status: AC
  Filled 2019-07-19: qty 20

## 2019-07-19 MED ORDER — MIDAZOLAM HCL 2 MG/2ML IJ SOLN
INTRAMUSCULAR | Status: AC
  Filled 2019-07-19: qty 2

## 2019-07-19 MED ORDER — KETOROLAC TROMETHAMINE 30 MG/ML IJ SOLN
INTRAMUSCULAR | Status: DC | PRN
  Administered 2019-07-19: 15 mg via INTRAVENOUS

## 2019-07-19 MED ORDER — FAMOTIDINE 20 MG PO TABS
20.0000 mg | ORAL_TABLET | Freq: Once | ORAL | Status: AC
  Administered 2019-07-19: 20 mg via ORAL

## 2019-07-19 MED ORDER — ONDANSETRON HCL 4 MG/2ML IJ SOLN
INTRAMUSCULAR | Status: DC | PRN
  Administered 2019-07-19: 4 mg via INTRAVENOUS

## 2019-07-19 MED ORDER — LACTATED RINGERS IV SOLN
INTRAVENOUS | Status: DC
  Administered 2019-07-19: 10:00:00 via INTRAVENOUS

## 2019-07-19 MED ORDER — LIDOCAINE HCL (PF) 2 % IJ SOLN
INTRAMUSCULAR | Status: AC
  Filled 2019-07-19: qty 10

## 2019-07-19 MED ORDER — IOHEXOL 180 MG/ML  SOLN
INTRAMUSCULAR | Status: DC | PRN
  Administered 2019-07-19: 10 mL

## 2019-07-19 MED ORDER — FENTANYL CITRATE (PF) 100 MCG/2ML IJ SOLN
INTRAMUSCULAR | Status: AC
  Filled 2019-07-19: qty 2

## 2019-07-19 MED ORDER — ROCURONIUM BROMIDE 50 MG/5ML IV SOLN
INTRAVENOUS | Status: AC
  Filled 2019-07-19: qty 1

## 2019-07-19 MED ORDER — DEXAMETHASONE SODIUM PHOSPHATE 10 MG/ML IJ SOLN
INTRAMUSCULAR | Status: DC | PRN
  Administered 2019-07-19: 8 mg via INTRAVENOUS

## 2019-07-19 MED ORDER — FENTANYL CITRATE (PF) 100 MCG/2ML IJ SOLN
INTRAMUSCULAR | Status: AC
  Administered 2019-07-19: 25 ug via INTRAVENOUS
  Filled 2019-07-19: qty 2

## 2019-07-19 MED ORDER — ONDANSETRON HCL 4 MG/2ML IJ SOLN
INTRAMUSCULAR | Status: AC
  Filled 2019-07-19: qty 2

## 2019-07-19 MED ORDER — CEFAZOLIN SODIUM-DEXTROSE 2-4 GM/100ML-% IV SOLN
INTRAVENOUS | Status: AC
  Filled 2019-07-19: qty 100

## 2019-07-19 MED ORDER — FENTANYL CITRATE (PF) 100 MCG/2ML IJ SOLN
INTRAMUSCULAR | Status: DC | PRN
  Administered 2019-07-19: 100 ug via INTRAVENOUS

## 2019-07-19 MED ORDER — BELLADONNA ALKALOIDS-OPIUM 16.2-60 MG RE SUPP
RECTAL | Status: AC
  Filled 2019-07-19: qty 1

## 2019-07-19 MED ORDER — LIDOCAINE HCL (CARDIAC) PF 100 MG/5ML IV SOSY
PREFILLED_SYRINGE | INTRAVENOUS | Status: DC | PRN
  Administered 2019-07-19: 100 mg via INTRAVENOUS

## 2019-07-19 MED ORDER — FAMOTIDINE 20 MG PO TABS
ORAL_TABLET | ORAL | Status: AC
  Filled 2019-07-19: qty 1

## 2019-07-19 MED ORDER — PROPOFOL 10 MG/ML IV BOLUS
INTRAVENOUS | Status: DC | PRN
  Administered 2019-07-19: 150 mg via INTRAVENOUS

## 2019-07-19 MED ORDER — MIDAZOLAM HCL 2 MG/2ML IJ SOLN
INTRAMUSCULAR | Status: DC | PRN
  Administered 2019-07-19: 2 mg via INTRAVENOUS

## 2019-07-19 MED ORDER — SULFAMETHOXAZOLE-TRIMETHOPRIM 800-160 MG PO TABS: 1.0000 | ORAL_TABLET | Freq: Every day | ORAL | 0 refills | Status: DC

## 2019-07-19 SURGICAL SUPPLY — 30 items
BAG DRAIN CYSTO-URO LG1000N (MISCELLANEOUS) ×2 IMPLANT
BASKET ZERO TIP 1.9FR (BASKET) ×2 IMPLANT
BRUSH SCRUB EZ 1% IODOPHOR (MISCELLANEOUS) ×2 IMPLANT
CATH URETL 5X70 OPEN END (CATHETERS) IMPLANT
CNTNR SPEC 2.5X3XGRAD LEK (MISCELLANEOUS)
CONT SPEC 4OZ STER OR WHT (MISCELLANEOUS)
CONTAINER SPEC 2.5X3XGRAD LEK (MISCELLANEOUS) IMPLANT
DRAPE UTILITY 15X26 TOWEL STRL (DRAPES) ×2 IMPLANT
FIBER LASER TRAC TIP (UROLOGICAL SUPPLIES) IMPLANT
GLOVE BIOGEL PI IND STRL 7.5 (GLOVE) ×1 IMPLANT
GLOVE BIOGEL PI INDICATOR 7.5 (GLOVE) ×1
GOWN STRL REUS W/ TWL LRG LVL3 (GOWN DISPOSABLE) ×1 IMPLANT
GOWN STRL REUS W/ TWL XL LVL3 (GOWN DISPOSABLE) ×1 IMPLANT
GOWN STRL REUS W/TWL LRG LVL3 (GOWN DISPOSABLE) ×1
GOWN STRL REUS W/TWL XL LVL3 (GOWN DISPOSABLE) ×1
GUIDEWIRE STR DUAL SENSOR (WIRE) ×2 IMPLANT
INFUSOR MANOMETER BAG 3000ML (MISCELLANEOUS) ×2 IMPLANT
INTRODUCER DILATOR DOUBLE (INTRODUCER) IMPLANT
KIT TURNOVER CYSTO (KITS) ×2 IMPLANT
PACK CYSTO AR (MISCELLANEOUS) ×2 IMPLANT
SET CYSTO W/LG BORE CLAMP LF (SET/KITS/TRAYS/PACK) ×2 IMPLANT
SHEATH URETERAL 12FRX35CM (MISCELLANEOUS) IMPLANT
SOL .9 NS 3000ML IRR  AL (IV SOLUTION) ×1
SOL .9 NS 3000ML IRR UROMATIC (IV SOLUTION) ×1 IMPLANT
STENT URET 6FRX24 CONTOUR (STENTS) ×2 IMPLANT
STENT URET 6FRX26 CONTOUR (STENTS) IMPLANT
SURGILUBE 2OZ TUBE FLIPTOP (MISCELLANEOUS) ×2 IMPLANT
SYR 10ML LL (SYRINGE) ×2 IMPLANT
VALVE UROSEAL ADJ ENDO (VALVE) IMPLANT
WATER STERILE IRR 1000ML POUR (IV SOLUTION) ×2 IMPLANT

## 2019-07-19 NOTE — Anesthesia Postprocedure Evaluation (Signed)
Anesthesia Post Note  Patient: Patricia Ray  Procedure(s) Performed: CYSTOSCOPY/URETEROSCOPY/HOLMIUM LASER/STENT PLACEMENT, 1. Cystoscopy, left ureteroscopy and basket extraction of ureteral stone, retrograde pyelogram with intraoperative interpretation, left ureteral stent placement on Dangler  (Left )  Patient location during evaluation: PACU Anesthesia Type: General Level of consciousness: awake and alert and oriented Pain management: pain level controlled Vital Signs Assessment: post-procedure vital signs reviewed and stable Respiratory status: spontaneous breathing, nonlabored ventilation and respiratory function stable Cardiovascular status: blood pressure returned to baseline and stable Postop Assessment: no signs of nausea or vomiting Anesthetic complications: no     Last Vitals:  Vitals:   07/19/19 1145 07/19/19 1155  BP: 114/71 130/87  Pulse: 100 92  Resp: 16 16  Temp: 36.6 C (!) 36.1 C  SpO2: 97% 99%    Last Pain:  Vitals:   07/19/19 1155  TempSrc: Temporal  PainSc: 0-No pain                 Tarence Searcy

## 2019-07-19 NOTE — Anesthesia Post-op Follow-up Note (Signed)
Anesthesia QCDR form completed.        

## 2019-07-19 NOTE — Anesthesia Procedure Notes (Signed)
Procedure Name: Intubation Date/Time: 07/19/2019 10:39 AM Performed by: Esaw Grandchild, CRNA Pre-anesthesia Checklist: Patient identified, Emergency Drugs available, Suction available and Patient being monitored Patient Re-evaluated:Patient Re-evaluated prior to induction Oxygen Delivery Method: Circle system utilized Preoxygenation: Pre-oxygenation with 100% oxygen Induction Type: IV induction Ventilation: Mask ventilation without difficulty Laryngoscope Size: Miller and 2 Grade View: Grade II Tube type: Oral Tube size: 7.0 mm Number of attempts: 1 Airway Equipment and Method: Stylet and Oral airway Placement Confirmation: ETT inserted through vocal cords under direct vision,  positive ETCO2 and breath sounds checked- equal and bilateral Secured at: 23 cm Tube secured with: Tape Dental Injury: Teeth and Oropharynx as per pre-operative assessment

## 2019-07-19 NOTE — Discharge Instructions (Signed)
AMBULATORY SURGERY  °DISCHARGE INSTRUCTIONS ° ° °1) The drugs that you were given will stay in your system until tomorrow so for the next 24 hours you should not: ° °A) Drive an automobile °B) Make any legal decisions °C) Drink any alcoholic beverage ° ° °2) You may resume regular meals tomorrow.  Today it is better to start with liquids and gradually work up to solid foods. ° °You may eat anything you prefer, but it is better to start with liquids, then soup and crackers, and gradually work up to solid foods. ° ° °3) Please notify your doctor immediately if you have any unusual bleeding, trouble breathing, redness and pain at the surgery site, drainage, fever, or pain not relieved by medication. ° ° ° °4) Additional Instructions: ° ° ° ° ° ° ° °Please contact your physician with any problems or Same Day Surgery at [phone], Monday through Friday 6 am to 4 pm, or [facility] Main number at [phone]. °

## 2019-07-19 NOTE — Transfer of Care (Signed)
Immediate Anesthesia Transfer of Care Note  Patient: Patricia Ray  Procedure(s) Performed: CYSTOSCOPY/URETEROSCOPY/HOLMIUM LASER/STENT PLACEMENT (Left )  Patient Location: PACU  Anesthesia Type:General  Level of Consciousness: awake, alert  and oriented  Airway & Oxygen Therapy: Patient Spontanous Breathing and Patient connected to nasal cannula oxygen  Post-op Assessment: Report given to RN and Post -op Vital signs reviewed and stable  Post vital signs: Reviewed and stable  Last Vitals:  Vitals Value Taken Time  BP 103/85 07/19/19 1115  Temp 36.7 C 07/19/19 1115  Pulse 103 07/19/19 1123  Resp 16 07/19/19 1123  SpO2 98 % 07/19/19 1123  Vitals shown include unvalidated device data.  Last Pain:  Vitals:   07/19/19 1012  TempSrc: Temporal  PainSc: 0-No pain         Complications: No apparent anesthesia complications

## 2019-07-19 NOTE — Op Note (Signed)
Date of procedure: 07/19/19  Preoperative diagnosis:  1. Left 4 mm distal ureteral stone  Postoperative diagnosis:  1. Same  Procedure: 1. Cystoscopy, left ureteroscopy and basket extraction of ureteral stone, retrograde pyelogram with intraoperative interpretation, left ureteral stent placement on Dangler  Surgeon: Nickolas Madrid, MD  Anesthesia: General  Complications: None  Intraoperative findings:  1.  Normal cystoscopy 2.  4 mm left distal ureteral stone basket extracted 3.  Uncomplicated left ureteral stent placement on Dangler  EBL: Minimal  Specimens: Stone for analysis  Drains: Left 6 French by 24 cm ureteral stent  Indication: Patricia Ray is a 48 y.o. patient with ongoing intermittent renal colic secondary to a 4 mm left ureteral stone over the last month.  She elected for definitive management with ureteroscopy today.  After reviewing the management options for treatment, they elected to proceed with the above surgical procedure(s). We have discussed the potential benefits and risks of the procedure, side effects of the proposed treatment, the likelihood of the patient achieving the goals of the procedure, and any potential problems that might occur during the procedure or recuperation. Informed consent has been obtained.  Description of procedure:  The patient was taken to the operating room and general anesthesia was induced. SCDs were placed for DVT prophylaxis. The patient was placed in the dorsal lithotomy position, prepped and draped in the usual sterile fashion, and preoperative antibiotics(Ancef) were administered. A preoperative time-out was performed.   A 21 French rigid cystoscope was used to intubate the urethra.  Thorough cystoscopy was performed and the bladder was grossly normal.  A sensor wire was advanced into the left ureteral orifice up into the kidney under fluoroscopic vision and passed easily.  A semirigid ureteroscope was advanced alongside the  wire and an oval-shaped 4 mm left distal ureteral stone was clearly identified.  This was grasped with a flexible basket and removed atraumatically from the ureter.  The ureter was then reinspected and there were no other residual fragments or ureteral injury.  Contrast was injected through the semirigid scope and opacified a normal-appearing left collecting system with no filling defects or hydronephrosis.  A 6 French by 24 cm stent was uneventfully placed over the wire with a shepherd's hook in the upper pole, and an excellent curl in the bladder.  Contrast drained briskly through the side-port of the stent.  The bladder was drained and this concluded our procedure.  A belladonna suppository was placed.  Disposition: Stable to PACU  Plan: Remove stent at home on Monday  Nickolas Madrid, MD

## 2019-07-19 NOTE — H&P (Signed)
UROLOGY H&P UPDATE  48 year old F that originally presented to the ED on 9/13 with left flank pain and CT showed a 33mm left proximal ureteral stone. I saw her in clinic on 9/22 and she had complete resolution of her pain and felt she had passed her stone. KUB at that visit did not show any definitive stone.   She then re-presented to the ED on 10/2 with acute onset of left groin pain, and repeat CT showed a 1mm distal ureteral stone.  We discussed options at length including observation vs ureteroscopy, and with her stone duration of ~ 1 month and intermittent pain, she would like to proceed with definitive management.  Cardiac: RRR Lungs: CTA bilaterally  Laterality: LEFT Procedure: Ureteroscopy, laser lithotripsy, stent placement  Urine:  Urine cx 9/13 no growth Urinalysis 10/2 rare bacteria, 6-10 squamous cells, nitrite negative, 0-5 WBCs, 6-10 RBCs  We specifically discussed the risks ureteroscopy including bleeding, infection/sepsis, stent related symptoms including flank pain/urgency/frequency/incontinence/dysuria, ureteral injury, inability to access stone, or need for staged or additional procedures.   Billey Co, MD 07/19/2019

## 2019-07-19 NOTE — Anesthesia Preprocedure Evaluation (Signed)
Anesthesia Evaluation  Patient identified by MRN, date of birth, ID band Patient awake    Reviewed: Allergy & Precautions, NPO status , Patient's Chart, lab work & pertinent test results  History of Anesthesia Complications Negative for: history of anesthetic complications  Airway Mallampati: II  TM Distance: >3 FB Neck ROM: Full    Dental no notable dental hx.    Pulmonary sleep apnea and Continuous Positive Airway Pressure Ventilation , neg COPD, Current Smoker and Patient abstained from smoking.,    breath sounds clear to auscultation- rhonchi (-) wheezing      Cardiovascular hypertension, Pt. on medications (-) CAD, (-) Past MI, (-) Cardiac Stents and (-) CABG  Rhythm:Regular Rate:Normal - Systolic murmurs and - Diastolic murmurs    Neuro/Psych neg Seizures negative neurological ROS  negative psych ROS   GI/Hepatic Neg liver ROS, GERD  ,  Endo/Other  negative endocrine ROSneg diabetes  Renal/GU Renal disease: nephrolithiasis.     Musculoskeletal  (+) Arthritis ,   Abdominal (+) + obese,   Peds  Hematology negative hematology ROS (+)   Anesthesia Other Findings Past Medical History: No date: Arthritis     Comment:  psoriatic arthritis No date: Endometriosis No date: GERD (gastroesophageal reflux disease) No date: History of kidney stones No date: Hypertension No date: Lupus ([place]) No date: Psoriasis     Comment:  scalp No date: Sleep apnea     Comment:  CPAP    Reproductive/Obstetrics                             Anesthesia Physical Anesthesia Plan  ASA: II  Anesthesia Plan: General   Post-op Pain Management:    Induction: Intravenous  PONV Risk Score and Plan: 1 and Ondansetron, Dexamethasone and Midazolam  Airway Management Planned: Oral ETT  Additional Equipment:   Intra-op Plan:   Post-operative Plan: Extubation in OR  Informed Consent: I have reviewed the  patients History and Physical, chart, labs and discussed the procedure including the risks, benefits and alternatives for the proposed anesthesia with the patient or authorized representative who has indicated his/her understanding and acceptance.     Dental advisory given  Plan Discussed with: CRNA and Anesthesiologist  Anesthesia Plan Comments:         Anesthesia Quick Evaluation

## 2019-07-22 ENCOUNTER — Encounter: Payer: Self-pay | Admitting: Urology

## 2019-07-27 LAB — CALCULI, WITH PHOTOGRAPH (CLINICAL LAB)
Calcium Oxalate Dihydrate: 20 %
Calcium Oxalate Monohydrate: 80 %
Weight Calculi: 13 mg

## 2019-07-30 ENCOUNTER — Telehealth: Payer: Self-pay

## 2019-07-30 NOTE — Telephone Encounter (Signed)
mychart sent.

## 2019-07-30 NOTE — Telephone Encounter (Signed)
-----   Message from Billey Co, MD sent at 07/30/2019  8:05 AM EDT ----- Her stone actually was calcium oxalate, please remind her of general stone prevention strategies including adequate hydration with goal of producing 2.5 L of urine daily, increasing citric acid intake, increasing calcium intake during high oxalate meals, minimizing animal protein, and decreasing salt intake.  Nickolas Madrid, MD 07/30/2019

## 2019-08-01 ENCOUNTER — Encounter: Payer: Self-pay | Admitting: Urology

## 2019-09-21 ENCOUNTER — Other Ambulatory Visit: Payer: Self-pay | Admitting: Rheumatology

## 2019-09-21 DIAGNOSIS — L409 Psoriasis, unspecified: Secondary | ICD-10-CM

## 2019-09-23 NOTE — Telephone Encounter (Signed)
Last Visit: 05/06/2019 Next Visit: 11/04/2019  Okay to refill per Dr. Estanislado Pandy.

## 2019-10-28 NOTE — Progress Notes (Signed)
Virtual Visit via Telephone Note  I connected with Patricia Ray on 11/04/19 at  8:15 AM EST by telephone and verified that I am speaking with the correct person using two identifiers.  Location: Patient: Home Provider: Clinic  This service was conducted via virtual visit. The patient was located at home. I was located in my office.  Consent was obtained prior to the virtual visit and is aware of possible charges through their insurance for this visit.  The patient is an established patient.  Dr. Estanislado Pandy, MD conducted the virtual visit and Hazel Sams, PA-C acted as scribe during the service.  Office staff helped with scheduling follow up visits after the service was conducted.     I discussed the limitations, risks, security and privacy concerns of performing an evaluation and management service by telephone and the availability of in person appointments. I also discussed with the patient that there may be a patient responsible charge related to this service. The patient expressed understanding and agreed to proceed.  CC: Stiffness in both hands History of Present Illness: Patricia Ray is a 49 y.o. female with history of psoriatic arthritis and discoid lupus.  She denies any recent psoriatic arthritis flares or discoid lupus lesions.  She is taking plaquenil 200 mg 1 tablet by mouth twice daily and Otelza 30 1 tablet by mouth BID. She denies any psoriasis or discoid lesions at this time. She experiences pain and stiffness in both hands in the morning. She states it is difficult to make a complete fist for about 1 hour daily.  She denies any other joint pain or joint swelling currently.   Review of Systems  Constitutional: Positive for malaise/fatigue. Negative for fever.  HENT: Negative for congestion.   Eyes: Negative for photophobia, pain, discharge and redness.  Respiratory: Negative for cough, shortness of breath and wheezing.   Cardiovascular: Negative for chest pain, palpitations and  leg swelling.  Gastrointestinal: Negative for blood in stool, constipation and diarrhea.  Genitourinary: Negative for dysuria and frequency.  Musculoskeletal: Positive for joint pain. Negative for back pain, myalgias and neck pain.       +Morning stiffness   Skin: Negative for rash.  Neurological: Negative for dizziness, weakness and headaches.  Psychiatric/Behavioral: Negative for depression and memory loss. The patient is not nervous/anxious and does not have insomnia.      Observations/Objective:  Physical Exam  Constitutional: She is oriented to person, place, and time.  Neurological: She is alert and oriented to person, place, and time.  Psychiatric: Mood, memory, affect and judgment normal.   Patient reports morning stiffness for 1 hour.   Patient reports nocturnal pain.  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  She is clinically doing well on plaquenil 200 mg 1 tablet by mouth once daily.  She will continue on the current dose.  She was advised to notify us if she develops signs or symptoms of a flare.   Psoriatic arthropathy ([place]): She has been experiencing intermittent pain, stiffness, and joint swelling in both hands in the morning for about 1 hour daily.  She states the discomfort is manageable and she does not want to make any medication changes at this time.  She is overall clinically doing well on Plaquenil 200 mg 1 tablet by mouth  twice daily and Otezla 30 mg 1 tablet BID. She will continue on the current regimen and she was advised to notify us if she develops increased joint pain or joint swelling.  She will follow up in 3 months and we will reassess at that time.   Psoriasis: She has no active  psoriasis at this time.  She will continue taking Otezla as prescribed.   High risk medication use - PLQ 200 mg 1 tablet by mouth twice daily and Otezla 30 mg BID. eye exam: 03/13/2019. She has a future appointment scheduled.  CBC and CMP were drawn on 07/12/19. Standing orders are in place. She was encouraged to receive the covid-19 vaccine once available to her.    Primary osteoarthritis of both knees: She has intermittent pain in both knee joints. She denies any warmth or joint inflammation at this time.  She has occasional difficulty climbing steps.    Right hand paresthesia: Resolved.   Other medical conditions are listed as follows:   Essential hypertension   Renal stone   Vitamin D insufficiency   OSA (obstructive sleep apnea) - on CPAP.   Follow Up Instructions: She will follow up in 3 months    I discussed the assessment and treatment plan with the patient. The patient was provided an opportunity to ask questions and all were answered. The patient agreed with the plan and demonstrated an understanding of the instructions.   The patient was advised to call back or seek an in-person evaluation if the symptoms worsen or if the condition fails to improve as anticipated.  I provided 25 minutes of non-face-to-face time during this encounter.  Bo Merino, MD  Scribed by-  Hazel Sams, PA-C

## 2019-11-04 ENCOUNTER — Telehealth (INDEPENDENT_AMBULATORY_CARE_PROVIDER_SITE_OTHER): Payer: 59 | Admitting: Rheumatology

## 2019-11-04 ENCOUNTER — Encounter: Payer: Self-pay | Admitting: Rheumatology

## 2019-11-04 ENCOUNTER — Other Ambulatory Visit: Payer: Self-pay

## 2019-11-04 DIAGNOSIS — L409 Psoriasis, unspecified: Secondary | ICD-10-CM

## 2019-11-04 DIAGNOSIS — I10 Essential (primary) hypertension: Secondary | ICD-10-CM

## 2019-11-04 DIAGNOSIS — L930 Discoid lupus erythematosus: Secondary | ICD-10-CM

## 2019-11-04 DIAGNOSIS — L4050 Arthropathic psoriasis, unspecified: Secondary | ICD-10-CM | POA: Diagnosis not present

## 2019-11-04 DIAGNOSIS — Z79899 Other long term (current) drug therapy: Secondary | ICD-10-CM | POA: Diagnosis not present

## 2019-11-04 DIAGNOSIS — E559 Vitamin D deficiency, unspecified: Secondary | ICD-10-CM

## 2019-11-04 DIAGNOSIS — R202 Paresthesia of skin: Secondary | ICD-10-CM

## 2019-11-04 DIAGNOSIS — N200 Calculus of kidney: Secondary | ICD-10-CM

## 2019-11-04 DIAGNOSIS — G4733 Obstructive sleep apnea (adult) (pediatric): Secondary | ICD-10-CM

## 2019-11-04 DIAGNOSIS — M170 Bilateral primary osteoarthritis of knee: Secondary | ICD-10-CM

## 2019-12-20 ENCOUNTER — Other Ambulatory Visit: Payer: Self-pay | Admitting: *Deleted

## 2019-12-20 DIAGNOSIS — L409 Psoriasis, unspecified: Secondary | ICD-10-CM

## 2019-12-20 MED ORDER — OTEZLA 30 MG PO TABS: 1.0000 | ORAL_TABLET | Freq: Two times a day (BID) | ORAL | 0 refills | Status: DC

## 2019-12-20 NOTE — Telephone Encounter (Signed)
Refill request received via fax  Last Visit: 11/04/19 Next Visit: 02/07/20  Per office note on 11/04/19 dose is: Otezla 30 mg 1 tablet BID.  Okay to refill per Dr. Estanislado Pandy

## 2020-01-05 ENCOUNTER — Ambulatory Visit: Payer: 59 | Attending: Internal Medicine

## 2020-01-05 DIAGNOSIS — Z23 Encounter for immunization: Secondary | ICD-10-CM

## 2020-01-05 NOTE — Progress Notes (Signed)
   Covid-19 Vaccination Clinic  Name:  Patricia Ray    MRN: [id number] DOB: [birth year]  01/05/2020  [name] was observed post Covid-19 immunization for 15 minutes without incident. She was provided with Vaccine Information Sheet and instruction to access the V-Safe system.   [name] was instructed to call 911 with any severe reactions post vaccine: Marland Kitchen Difficulty breathing  . Swelling of face and throat  . A fast heartbeat  . A bad rash all over body  . Dizziness and weakness   Immunizations Administered    Name Date Dose VIS Date Route   Pfizer COVID-19 Vaccine 01/05/2020 11:45 AM 0.3 mL 09/20/2019 Intramuscular   Manufacturer: [employer], [employer]   Lot: [id number]   [place]: [id number]

## 2020-01-28 ENCOUNTER — Telehealth: Payer: Self-pay | Admitting: Pharmacy Technician

## 2020-01-28 NOTE — Progress Notes (Signed)
Office Visit Note  Patient: Patricia Ray             Date of Birth: May 04, 1971           MRN: [id number]             PCP: Sharyne Peach, MD Referring: Sharyne Peach, MD Visit Date: 02/07/2020 Occupation: @GUAROCC @  Subjective:  Right hand pain   History of Present Illness: Patricia Ray is a 49 y.o. female with history of psoriatic arthritis, osteoarthritis, discoid lupus.  Patient is taking Plaquenil 200 mg 1 tablet twice daily and Otezla 30 mg twice daily.  She has not missed any doses of Plaquenil or Otezla recently.  She denies any recent discoid lesions.  She has not had any new rashes.  She denies any recent psoriatic arthritis flares.  She states she is having some discomfort in the right fifth digit but denies any injury.  She has noticed a knot on her right fifth PIP, which has concerned her.  She is also been experiencing increased discomfort in both elbows.  She denies any joint swelling.  She states the discomfort is worse when lying at night or while typing.  She occasionally experiences paresthesias in the right hand.  She would like x-rays of her hands performed today.  She denies any Achilles tendinitis or plantar fasciitis.  She has no SI joint tenderness at this time.  She has no psoriasis currently.   Activities of Daily Living:  Patient reports joint stiffness all day  Patient Denies nocturnal pain.  Difficulty dressing/grooming: Denies Difficulty climbing stairs: Denies Difficulty getting out of chair: Denies Difficulty using hands for taps, buttons, cutlery, and/or writing: Denies  Review of Systems  Constitutional: Positive for fatigue.  HENT: Negative for mouth sores, mouth dryness and nose dryness.   Eyes: Positive for dryness. Negative for pain and visual disturbance.  Respiratory: Negative for cough, hemoptysis, shortness of breath and difficulty breathing.   Cardiovascular: Negative for chest pain, palpitations, hypertension and swelling in  legs/feet.  Gastrointestinal: Negative for blood in stool, constipation and diarrhea.  Endocrine: Negative for heat intolerance and increased urination.  Genitourinary: Negative for difficulty urinating and painful urination.  Musculoskeletal: Positive for arthralgias, joint pain, joint swelling and morning stiffness. Negative for myalgias, muscle weakness, muscle tenderness and myalgias.  Skin: Negative for color change, pallor, rash, hair loss, nodules/bumps, skin tightness, ulcers and sensitivity to sunlight.  Allergic/Immunologic: Negative for susceptible to infections.  Neurological: Positive for numbness. Negative for dizziness, headaches and weakness.  Hematological: Positive for bruising/bleeding tendency. Negative for swollen glands.  Psychiatric/Behavioral: Positive for sleep disturbance. Negative for depressed mood. The patient is not nervous/anxious.     PMFS History:  Patient Active Problem List   Diagnosis Date Noted  . OSA (obstructive sleep apnea) 10/30/2018  . Primary osteoarthritis of both knees 08/23/2018  . Psoriatic arthritis ([place]) 05/10/2018  . Psoriasis of scalp 05/10/2018  . 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 High risk medication use 05/10/2018  . Essential hypertension 05/10/2018  . Other insomnia 05/10/2018  . Elevated prolactin level 05/10/2018  . Vitamin D insufficiency 05/10/2018  . Lupus erythematosus 04/11/2018  . Inflammatory polyarthropathy ([place]) 04/11/2018  . Renal stone 02/21/2016  . Notalgia 02/07/2016  . Increased prolactin level 03/19/2015    Past Medical History:  Diagnosis Date  . Arthritis    psoriatic arthritis  . Endometriosis   . GERD (gastroesophageal reflux disease)   . History of kidney stones   . Hypertension   .  Psoriasis    scalp  . Sleep apnea    CPAP   . Systemic lupus erythematosus (HCC)     Family History  Problem Relation Age of Onset  . Hypercalcemia Mother   . High Cholesterol Mother   . Colon cancer Maternal  Grandmother   . Diabetes Father   . Bipolar disorder Sister   . Depression Sister   . Breast cancer Neg Hx    Past Surgical History:  Procedure Laterality Date  . BREAST BIOPSY Right 04/04/2018   BENIGN EPIDERMAL INCLUSION CYST   . BUNIONECTOMY    . CESAREAN SECTION    . CYSTOSCOPY/URETEROSCOPY/HOLMIUM LASER/STENT PLACEMENT Left 07/19/2019   Procedure: CYSTOSCOPY/URETEROSCOPY/HOLMIUM LASER/STENT PLACEMENT, 1. Cystoscopy, left ureteroscopy and basket extraction of ureteral stone, retrograde pyelogram with intraoperative interpretation, left ureteral stent placement on [place]ang[place] ;  Surgeon: Billey Co, MD;  Location: [facility] ORS;  Service: Urology;  Laterality: Left;  . FOOT SURGERY    . REDUCTION MAMMAPLASTY  2005  . TONSILLECTOMY    . TUBAL LIGATION     Social History   Social History Narrative  . Not on file   Immunization History  Administered Date(s) Administered  . Influenza,inj,Quad PF,6+ Mos 09/16/2016  . Influenza,inj,quad, With Preservative 09/26/2016  . Influenza-Unspecified 07/12/2017, 07/12/2018  . PFIZER SARS-COV-2 Vaccination 01/05/2020, 01/29/2020  . Pneumococcal Polysaccharide-23 11/04/2016  . Tdap 11/04/2016     Objective: Vital Signs: BP 129/87 (BP Location: Left Arm, Patient Position: Sitting, Cuff Size: Normal)   Pulse (!) 107   Resp 14   Ht 5' 2.5" (1.588 m)   Wt 196 lb (88.9 kg)   LMP 10/02/2014 Comment: denies preg  BMI 35.28 kg/m    Physical Exam Vitals and nursing note reviewed.  Constitutional:      Appearance: She is well-developed.  HENT:     Head: Normocephalic and atraumatic.  Eyes:     Conjunctiva/sclera: Conjunctivae normal.  Pulmonary:     Effort: Pulmonary effort is normal.  Abdominal:     General: Bowel sounds are normal.     Palpations: Abdomen is soft.  Musculoskeletal:     Cervical back: Normal range of motion.  Lymphadenopathy:     Cervical: No cervical adenopathy.  Skin:    General: Skin is warm and dry.      Capillary Refill: Capillary refill takes less than 2 seconds.  Neurological:     Mental Status: She is alert and oriented to person, place, and time.  Psychiatric:        Behavior: Behavior normal.      Musculoskeletal Exam: C-spine, thoracic spine, lumbar spine good range of motion.  No midline spinal tenderness.  No SI joint tenderness.  Shoulder joints, wrist joints, MCPs, PIPs, DIPs good range of motion with no synovitis. Medial epicondylitis bilaterally.  She has mild PIP and DIP thickening.  Tenderness of the right fifth PIP joint noted.  Mucin cyst present on the right fifth DIP.  No signs of synovitis or dactylitis on exam.  Hip joints, knee joints and ankle joints, MTPs, PIPs and DIPs good range of motion with no synovitis.  No warmth or effusion of bilateral knee joints.  She has bilateral knee crepitus.  Ankle joints have good range of motion no tenderness or inflammation.  No Achilles tendinitis or plantar fasciitis.  She has complete fist formation bilaterally.  CDAI Exam: CDAI Score: 3.9  Patient Global: 6 mm; Provider Global: 3 mm Swollen: 0 ; Tender: 3  Joint Exam 02/07/2020  Right  Left  Elbow   Tender   Tender  PIP 5   Tender        Investigation: No additional findings.  Imaging: No results found.  Recent Labs: Lab Results  Component Value Date   WBC 12.4 (H) 07/12/2019   HGB 15.6 (H) 07/12/2019   PLT 310 07/12/2019   NA 141 07/12/2019   K 3.4 (L) 07/12/2019   CL 108 07/12/2019   CO2 24 07/12/2019   GLUCOSE 114 (H) 07/12/2019   BUN 14 07/12/2019   CREATININE 0.86 07/12/2019   BILITOT 0.7 07/12/2019   ALKPHOS 55 07/12/2019   AST 19 07/12/2019   ALT 16 07/12/2019   PROT 7.5 07/12/2019   ALBUMIN 4.3 07/12/2019   CALCIUM 9.9 07/12/2019   GFRAA >60 07/12/2019   QFTBGOLDPLUS NEGATIVE 05/10/2018    Speciality Comments: PLQ eye exam: 03/13/2019 normal. [facility]. Follow up in 6 months. Prior therapy: MTX (allergy-hospitalized for 1 wk after taking  MTX and simvastatin).  Procedures:  No procedures performed Allergies: Simvastatin, Methotrexate, and Nickel   Assessment / Plan:     Visit Diagnoses: Psoriatic arthropathy ([place]): She has no synovitis or dactylitis on exam.  She has not had any recent psoriatic arthritis flares.  She is clinically doing well on Plaquenil 200 mg 1 tablet by mouth twice daily and Otezla 30 mg 1 tablet twice daily.  She is tolerating both medications without any side effects.  She has been having  increased discomfort in the right fifth PIP joint but does not have any synovitis or signs of dactylitis on exam.  She is able to make a complete fist bilaterally.  X-rays of both hands were obtained today to assess for interval change.  She is not experiencing any Achilles tendinitis or plantar fasciitis.  She has no SI joint tenderness on exam.  She has no active psoriasis currently.  Her psoriatic arthritis is well controlled on the current treatment regimen.  She will continue taking Plaquenil 200 mg 1 tablet by mouth twice daily and Otezla 30 mg 1 tablet twice daily.  She does not need any refills at this time.  She was advised to notify us if she develops increased joint pain or joint swelling.  She will follow-up in the office in 5 months.  Psoriasis: She has no active psoriasis at this time.  High risk medication use - PLQ 200 mg 1 tablet by mouth twice daily and Otezla 30 mg BID. eye exam: 03/13/2019.  She has had a recent PLQ eye exam, so we will try to obtain these records.  CBC and CMP were drawn on 07/12/2019.  She is due to update lab work today.  Orders for CBC and CMP were released.   - Plan: CBC with Differential/Platelet, COMPLETE METABOLIC PANEL WITH GFR  Discoid lupus - RNP+, ANA 1:160 speckled, Ro+: She has not had any recent discoid lesions.  She will continue taking Plaquenil 200 mg 1 tablet by mouth twice daily. She was encouraged to wear sunscreen SPF greater than 50 on a daily basis and avoid direct sun  exposure.  She was advised to notify us if she develops signs or symptoms of a flare.   Primary osteoarthritis of both knees: She has good range of motion of both knee joints on exam.  No warmth or effusion was noted.  Pain in both hands -she has been having increased discomfort in the right fifth PIP joint.  She has a possible mucinous cyst over  the right fifth PIP.  No tenderness or synovitis was noted on exam today.  She is able to make a complete fist bilaterally.  She requested updated x-rays of both hands today.  Joint protection and muscle strengthening were discussed.  She was encouraged to continue to use Voltaren gel topically as needed for pain relief.   Plan: XR Hand 2 View Left, XR Hand 2 View Right  Medial epicondylitis of both elbows: She has tenderness over the medial epicondyle of bilateral elbows.  She has good range of motion of both elbow joints.  No tenderness or synovitis along the elbow joint was noted.  Her discomfort is worse with compression while lying in bed at night as well as while typing at her desk.  We discussed applying Voltaren gel topically as needed.  We also discussed performing stretching exercises on a daily basis.  A handout of these exercises were included in her AVS.  She declined physical therapy at this time.  She was advised to notify us if her symptoms persist or worsen.  Right hand paresthesia: She experiences intermittent paresthesias in the right hand in the ulnar distribution.  Her symptoms are likely due to compression of the ulnar nerve at the right elbow.  She declined further testing at this time.  She was encouraged to apply Voltaren gel topically over the medial epicondyle of the right elbow.  Other medical conditions are listed as follows:   Essential hypertension  Renal stone  OSA (obstructive sleep apnea)  Vitamin D insufficiency  Other insomnia  Other fatigue    Orders: Orders Placed This Encounter  Procedures  . XR Hand 2 View  Left  . XR Hand 2 View Right  . CBC with Differential/Platelet  . COMPLETE METABOLIC PANEL WITH GFR   No orders of the defined types were placed in this encounter.   Face-to-face time spent with patient was 30 minutes. Greater than 50% of time was spent in counseling and coordination of care.  Follow-Up Instructions: Return in about 5 months (around 07/09/2020) for Psoriatic arthritis, Osteoarthritis.   Ofilia Neas, PA-C  Note - This record has been created using Dragon software.  Chart creation errors have been sought, but may not always  have been located. Such creation errors do not reflect on  the standard of medical care.

## 2020-01-28 NOTE — Telephone Encounter (Signed)
Submitted a Prior Authorization request to [employer] for [place] via Cover My Meds. Will update once we receive a response.  ([employer]) - [id number]

## 2020-01-28 NOTE — Telephone Encounter (Signed)
Received notification from [place] regarding a prior authorization for [place]. Authorization has been APPROVED from 12/29/19 to 01/27/21.   Authorization # [id number]

## 2020-01-29 ENCOUNTER — Ambulatory Visit: Payer: 59 | Attending: Internal Medicine

## 2020-01-29 DIAGNOSIS — Z23 Encounter for immunization: Secondary | ICD-10-CM

## 2020-01-29 NOTE — Progress Notes (Signed)
   Covid-19 Vaccination Clinic  Name:  Patricia Ray    MRN: [id number] DOB: 1971-09-18  01/29/2020  Ms. [name] was observed post Covid-19 immunization for 15 minutes without incident. She was provided with Vaccine Information Sheet and instruction to access the V-Safe system.   Ms. [name] was instructed to call 911 with any severe reactions post vaccine: Marland Kitchen Difficulty breathing  . Swelling of face and throat  . A fast heartbeat  . A bad rash all over body  . Dizziness and weakness   Immunizations Administered    Name Date Dose VIS Date Route   Pfizer COVID-19 Vaccine 01/29/2020  4:08 PM 0.3 mL 12/04/2018 Intramuscular   Manufacturer: [place]   Lot: [id number]   [place]: [id number]

## 2020-02-07 ENCOUNTER — Ambulatory Visit (INDEPENDENT_AMBULATORY_CARE_PROVIDER_SITE_OTHER): Payer: 59 | Admitting: Physician Assistant

## 2020-02-07 ENCOUNTER — Ambulatory Visit: Payer: Self-pay

## 2020-02-07 ENCOUNTER — Other Ambulatory Visit: Payer: Self-pay

## 2020-02-07 ENCOUNTER — Telehealth: Payer: Self-pay | Admitting: *Deleted

## 2020-02-07 ENCOUNTER — Encounter: Payer: Self-pay | Admitting: Physician Assistant

## 2020-02-07 VITALS — BP 129/87 | HR 107 | Resp 14 | Ht 62.5 in | Wt 196.0 lb

## 2020-02-07 DIAGNOSIS — L4050 Arthropathic psoriasis, unspecified: Secondary | ICD-10-CM

## 2020-02-07 DIAGNOSIS — E559 Vitamin D deficiency, unspecified: Secondary | ICD-10-CM

## 2020-02-07 DIAGNOSIS — L930 Discoid lupus erythematosus: Secondary | ICD-10-CM

## 2020-02-07 DIAGNOSIS — G4709 Other insomnia: Secondary | ICD-10-CM

## 2020-02-07 DIAGNOSIS — M79641 Pain in right hand: Secondary | ICD-10-CM

## 2020-02-07 DIAGNOSIS — M7702 Medial epicondylitis, left elbow: Secondary | ICD-10-CM

## 2020-02-07 DIAGNOSIS — L409 Psoriasis, unspecified: Secondary | ICD-10-CM | POA: Diagnosis not present

## 2020-02-07 DIAGNOSIS — Z79899 Other long term (current) drug therapy: Secondary | ICD-10-CM | POA: Diagnosis not present

## 2020-02-07 DIAGNOSIS — R5383 Other fatigue: Secondary | ICD-10-CM

## 2020-02-07 DIAGNOSIS — I10 Essential (primary) hypertension: Secondary | ICD-10-CM

## 2020-02-07 DIAGNOSIS — M170 Bilateral primary osteoarthritis of knee: Secondary | ICD-10-CM

## 2020-02-07 DIAGNOSIS — N200 Calculus of kidney: Secondary | ICD-10-CM

## 2020-02-07 DIAGNOSIS — G4733 Obstructive sleep apnea (adult) (pediatric): Secondary | ICD-10-CM

## 2020-02-07 DIAGNOSIS — M79642 Pain in left hand: Secondary | ICD-10-CM | POA: Diagnosis not present

## 2020-02-07 DIAGNOSIS — M7701 Medial epicondylitis, right elbow: Secondary | ICD-10-CM

## 2020-02-07 DIAGNOSIS — R202 Paresthesia of skin: Secondary | ICD-10-CM

## 2020-02-07 NOTE — Patient Instructions (Signed)
[facility] Ask your health care provider which exercises are safe for you. Do exercises exactly as told by your health care provider and adjust them as directed. It is normal to feel mild stretching, pulling, tightness, or discomfort as you do these exercises. Stop right away if you feel sudden pain or your pain gets worse. Do not begin these exercises until told by your health care provider. Stretching and range-of-motion exercises These exercises warm up your muscles and joints and improve the movement and flexibility of your elbow. Wrist extension  1. Straighten your left / right elbow in front of you with your palm facing up toward the ceiling. ? If told by your health care provider, bend your left / right elbow to a 90-degree angle (right angle) at your side. 2. With your other hand, gently pull your left / right hand and fingers toward the floor (extension). Stop when you feel a gentle stretch on the palm side of your forearm. 3. Hold this position for __________ seconds. Repeat __________ times. Complete this exercise __________ times a day. Wrist flexion  1. Straighten your left / right elbow in front of you with your palm facing down toward the floor. ? If told by your health care provider, bend your left / right elbow to a 90-degree angle (right angle) at your side. 2. With your other hand, gently push over the back of your left / right hand so your fingers point toward the floor (flexion). Stop when you feel a gentle stretch on the back of your forearm. 3. Hold this position for __________ seconds. Repeat __________ times. Complete this exercise __________ times a day. Forearm rotation, supination 1. Sit or stand with your elbows at your side. 2. Bend your left / right elbow to a 90-degree angle (right angle). 3. Using your uninjured hand, turn your left / right palm up toward the ceiling (supination) until you feel a gentle stretch along the inside of your forearm. 4. Hold  this position for __________ seconds. Repeat __________ times. Complete this exercise __________ times a day. Forearm rotation, pronation 1. Sit or stand with your elbows at your side. 2. Bend your left / right elbow to a 90-degree angle (right angle). 3. Using your uninjured hand, turn your left / right palm down toward the floor (pronation) until you feel a gentle stretch along the top of your forearm. 4. Hold this position for __________ seconds. Repeat __________ times. Complete this exercise __________ times a day. Strengthening exercises These exercises build strength and endurance in your elbow. Endurance is the ability to use your muscles for a long time, even after they get tired. Wrist flexion  1. Sit with your left / right forearm supported on a table or other surface and your palm turned up toward the ceiling. Let your left / right wrist extend over the edge of the surface. 2. Hold a __________ weight or a piece of rubber exercise band or tubing. ? If using a rubber exercise band or tubing, hold the other end of the tubing with your other hand. 3. Slowly bend your wrist so your hand moves up toward the ceiling (flexion). Try to only move your wrist and keep the rest of your arm still. 4. Hold this position for __________ seconds. 5. Slowly return to the starting position. Repeat __________ times. Complete this exercise __________ times a day. Wrist flexion, eccentric 1. Sit with your left / right forearm palm-up and supported on a table or other surface.   Let your left / right wrist extend over the edge of the surface. 2. Hold a __________ weight or a piece of rubber exercise band or tubing in your left / right hand. ? If using a rubber exercise band or tubing, hold the other end of the tubing with your other hand. 3. Use your uninjured hand to move your left / right hand up toward the ceiling. 4. Take your uninjured hand away and slowly return to the starting position using only  your left / right hand (eccentric flexion). Repeat __________ times. Complete this exercise __________ times a day. Forearm rotation, pronation To do this exercise, you will need a lightweight hammer or rubber mallet. 1. Sit with your left / right forearm supported on a table or other surface. Bend your elbow to a 90-degree angle (right angle). Position your forearm so that your palm is facing up toward the ceiling, with your hand resting over the edge of the table. 2. Hold a hammer in your left / right hand. ? To make this exercise easier, hold the hammer near the head of the hammer. ? To make this exercise harder, hold the hammer near the end of the handle. 3. Without moving your elbow, slowly turn (rotate) your forearm so your palm faces down toward the floor (pronation). 4. Hold this position for __________ seconds. 5. Slowly return to the starting position. Repeat __________ times. Complete this exercise __________ times a day. Shoulder blade squeeze 1. Sit in a stable chair or stand with good posture. If you are sitting down, do not let your back touch the back of the chair. 2. Your arms should be at your sides with your elbows bent to a 90-degree angle (right angle). Position your forearms so that your thumbs are facing the ceiling (neutral position). 3. Without lifting your shoulders up, squeeze your shoulder blades tightly together. 4. Hold this position for __________ seconds. 5. Slowly release and return to the starting position. Repeat __________ times. Complete this exercise __________ times a day. This information is not intended to replace advice given to you by your health care provider. Make sure you discuss any questions you have with your health care provider. Document Revised: 01/17/2019 Document Reviewed: 11/20/2018 Elsevier Patient Education  2020 Elsevier Inc.  

## 2020-02-07 NOTE — Telephone Encounter (Signed)
LMOM for [place] at [phone] to fax PLQ eye exam

## 2020-02-08 LAB — COMPLETE METABOLIC PANEL WITH GFR
AG Ratio: 1.6 (calc) (ref 1.0–2.5)
ALT: 13 U/L (ref 6–29)
AST: 14 U/L (ref 10–35)
Albumin: 4.3 g/dL (ref 3.6–5.1)
Alkaline phosphatase (APISO): 61 U/L (ref 37–153)
BUN: 13 mg/dL (ref 7–25)
CO2: 24 mmol/L (ref 20–32)
Calcium: 9.6 mg/dL (ref 8.6–10.4)
Chloride: 107 mmol/L (ref 98–110)
Creat: 0.77 mg/dL (ref 0.50–1.05)
GFR, Est African American: 104 mL/min/{1.73_m2} (ref >=60)
GFR, Est Non African American: 90 mL/min/{1.73_m2} (ref >=60)
Globulin: 2.7 g/dL (calc) (ref 1.9–3.7)
Glucose, Bld: 97 mg/dL (ref 65–99)
Potassium: 4.7 mmol/L (ref 3.5–5.3)
Sodium: 142 mmol/L (ref 135–146)
Total Bilirubin: 0.6 mg/dL (ref 0.2–1.2)
Total Protein: 7 g/dL (ref 6.1–8.1)

## 2020-02-08 LAB — CBC WITH DIFFERENTIAL/PLATELET
Absolute Monocytes: 766 cells/uL (ref 200–950)
Basophils Absolute: 53 cells/uL (ref 0–200)
Basophils Relative: 0.6 %
Eosinophils Absolute: 44 cells/uL (ref 15–500)
Eosinophils Relative: 0.5 %
HCT: 46.2 % — ABNORMAL HIGH (ref 35.0–45.0)
Hemoglobin: 15.5 g/dL (ref 11.7–15.5)
Lymphs Abs: 1830 cells/uL (ref 850–3900)
MCH: 29.6 pg (ref 27.0–33.0)
MCHC: 33.5 g/dL (ref 32.0–36.0)
MCV: 88.2 fL (ref 80.0–100.0)
MPV: 10 fL (ref 7.5–12.5)
Monocytes Relative: 8.7 %
Neutro Abs: 6107 cells/uL (ref 1500–7800)
Neutrophils Relative %: 69.4 %
Platelets: 282 10*3/uL (ref 140–400)
RBC: 5.24 10*6/uL — ABNORMAL HIGH (ref 3.80–5.10)
RDW: 14.3 % (ref 11.0–15.0)
Total Lymphocyte: 20.8 %
WBC: 8.8 10*3/uL (ref 3.8–10.8)

## 2020-02-10 NOTE — Progress Notes (Signed)
RBC count and Hct are borderline elevated.  Rest of CBC WNL.  CMP WNL.

## 2020-03-28 IMAGING — CT CT RENAL STONE PROTOCOL
2 of 4 series · 16 of 46 positions shown, 18 images · non-contrast
Comparison: 06/23/2019

CLINICAL DATA: Flank pain.  Left lower abdominal pain

EXAM:
CT ABDOMEN AND PELVIS WITHOUT CONTRAST
TECHNIQUE: Multidetector CT imaging of the abdomen and pelvis was performed
following the standard protocol without IV contrast.

[Series 2: stone full standard · axial · 0.72mm/px · z∈[-1053,-613]mm · 13 of 98 slices shown, 15 images]
[im 5/98  soft-tissue]
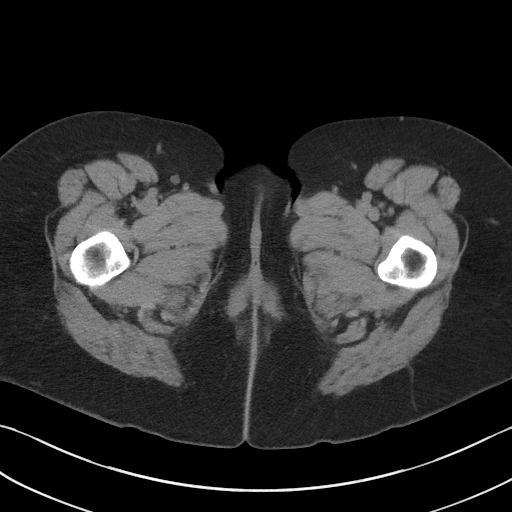
[im 5/98  bone]
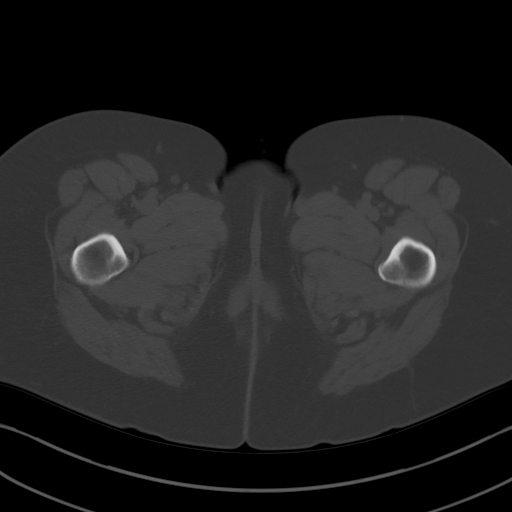
[im 13/98  soft-tissue]
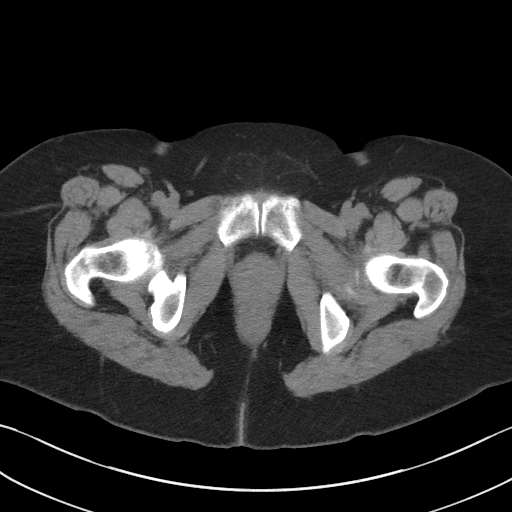
[im 21/98  soft-tissue]
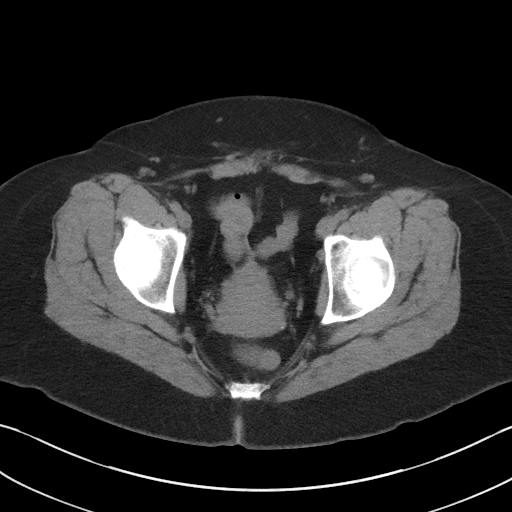
[im 29/98  soft-tissue]
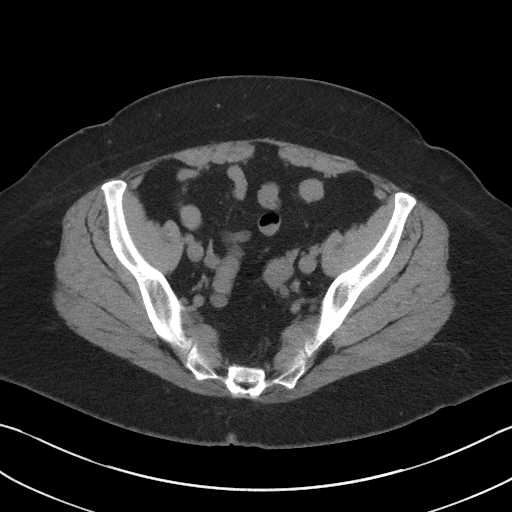
[im 33/98  soft-tissue]
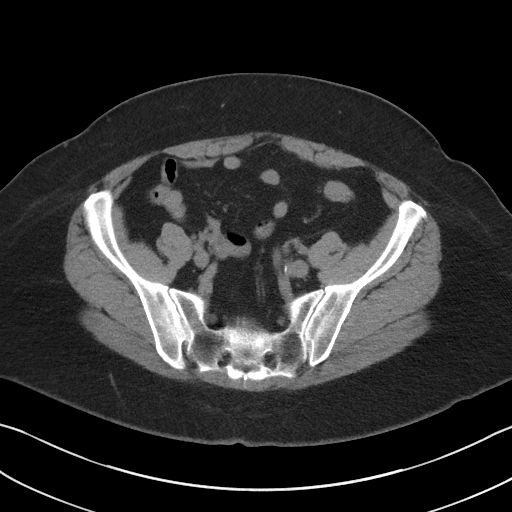
[im 41/98  soft-tissue]
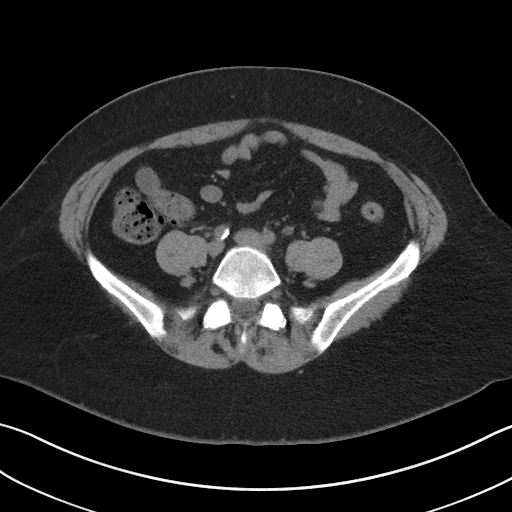
[im 49/98  soft-tissue]
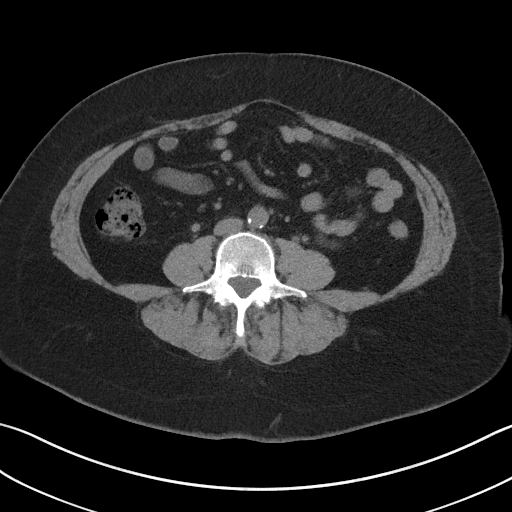
[im 57/98  soft-tissue]
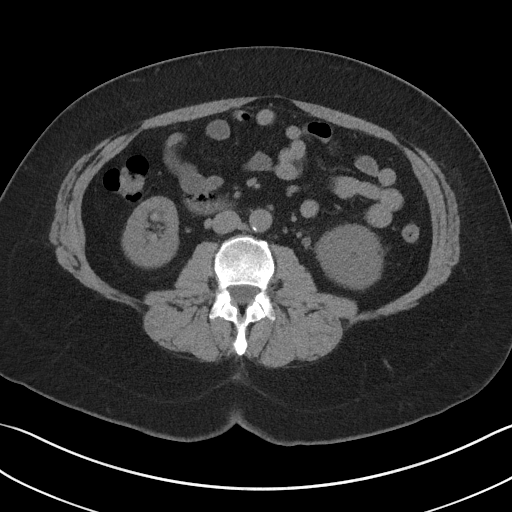
[im 65/98  soft-tissue]
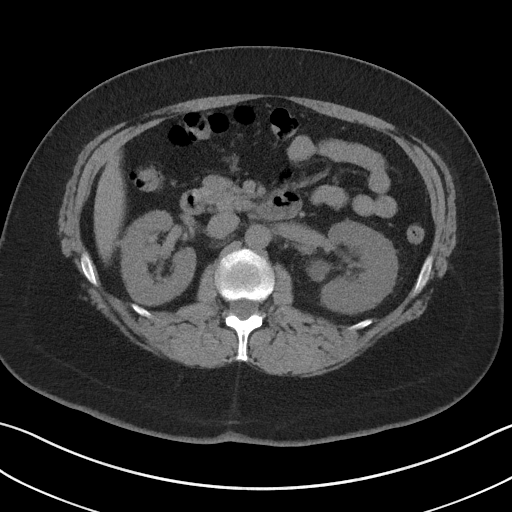
[im 65/98  bone]
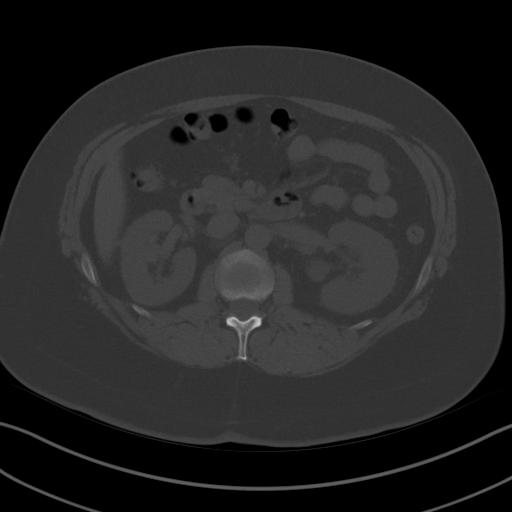
[im 69/98  soft-tissue]
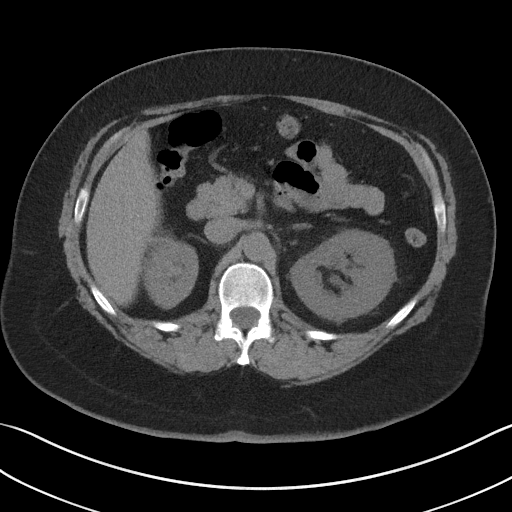
[im 77/98  soft-tissue]
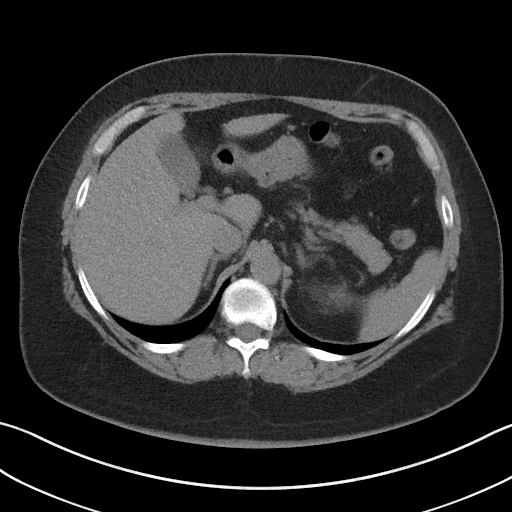
[im 85/98  soft-tissue]
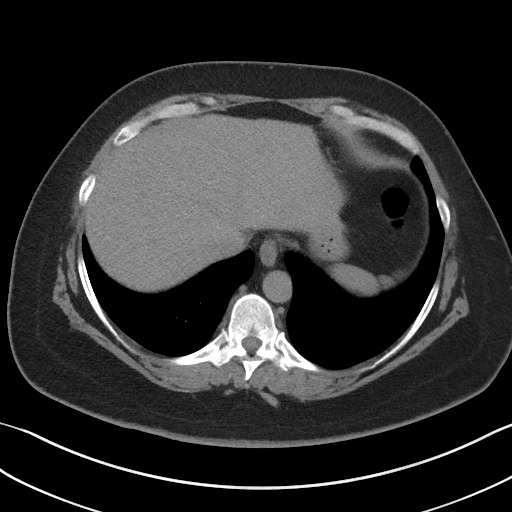
[im 93/98  soft-tissue]
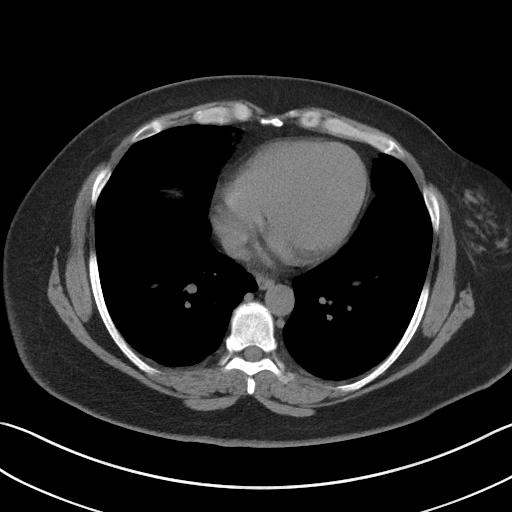

[Series 5: coronal · coronal · 0.79mm/px · 3 of 128 slices shown]
[im 43/128  soft-tissue]
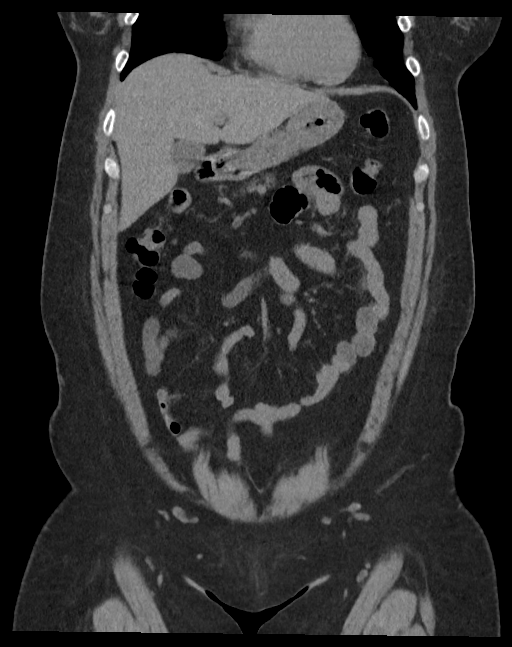
[im 57/128  soft-tissue]
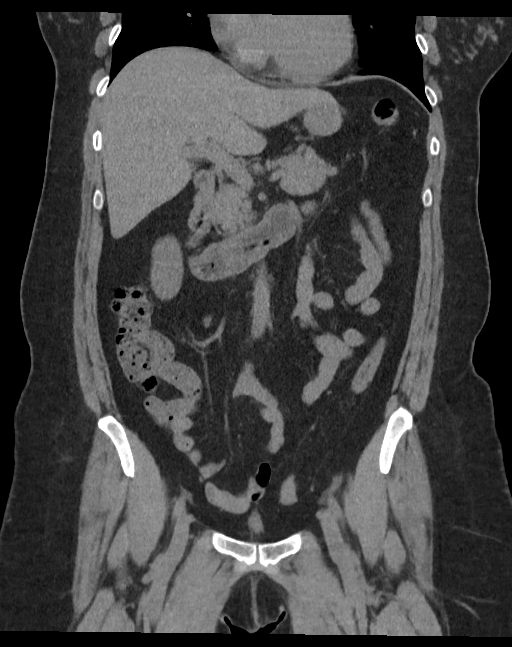
[im 71/128  soft-tissue]
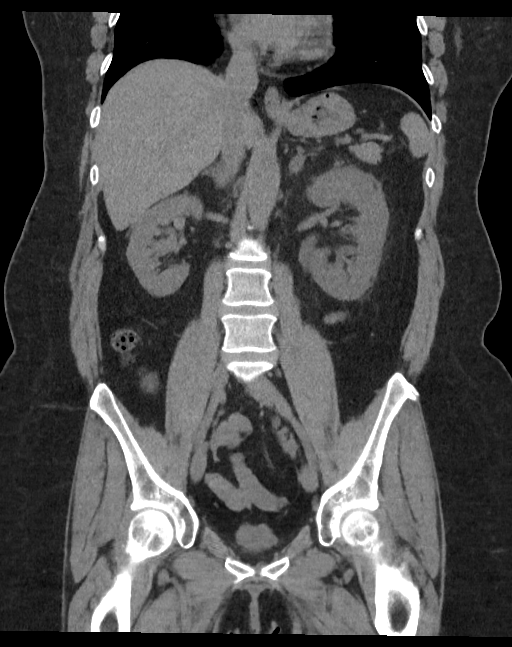

[16 of 46 positions shown; findings below may reference images not displayed]

FINDINGS: Lower chest:  No contributory findings.

Hepatobiliary: No focal liver abnormality.No evidence of biliary
obstruction or stone.

Pancreas: Unremarkable.

Spleen: Unremarkable.

Adrenals/Urinary Tract: Negative adrenals. 2 x 4 mm stone at the
left UVJ with mild hydroureteronephrosis and left renal expansion,
stone migration since prior. Destructive changes have mildly
progressed. No additional urolithiasis. Unremarkable bladder.

Stomach/Bowel:  No obstruction. No appendicitis.

Vascular/Lymphatic: No acute vascular abnormality. Atherosclerotic
calcification that is mild to moderate for age. No mass or
adenopathy.

Reproductive:Small Bartholin's gland cyst on the left, 9 mm in
diameter.

Other: No ascites or pneumoperitoneum.

Musculoskeletal: No acute abnormalities. Lumbar degenerative facet
spurring
IMPRESSION: 4 x 2 mm left UVJ calculus which has migrated distally since
06/23/2019. Associated obstructive changes have mildly progressed
from prior.

## 2020-05-01 ENCOUNTER — Telehealth: Payer: Self-pay | Admitting: Rheumatology

## 2020-05-01 NOTE — Telephone Encounter (Signed)
Ok to provide necessary documentation recommending that the patient continue to work from home if possible.

## 2020-05-01 NOTE — Telephone Encounter (Signed)
Patient called stating she just received an email from her employer [employer] that they will be starting a new Hybrid work schedule on 06/22/20.  The new schedule will require her to be in the office 2 days per week Monday/Thursday.  Patient states she will be sitting less than 3 feet apart from approximately 25-30 people in her area alone.  Patient wants Dr. Arlean Hopping opinion on returning to the office with her diagnosis of Lupus.  Patient states she can submit documentation with a letter from Dr. Estanislado Pandy if she thinks it is best to continue working from home.  Patient requested a return call before the end of the day on Monday, 05/04/20 if possible so she can notify her supervisor.

## 2020-05-01 NOTE — Telephone Encounter (Signed)
Patient advised per Hazel Sams, PA-C Ok to provide necessary documentation recommending that the patient continue to work from home if possible. Patient states she is going to call her manager to make sure of what needs to be in the letter and call the office to advise prior to the letter being written.

## 2020-05-27 ENCOUNTER — Other Ambulatory Visit: Payer: Self-pay | Admitting: *Deleted

## 2020-05-27 DIAGNOSIS — L409 Psoriasis, unspecified: Secondary | ICD-10-CM

## 2020-05-27 MED ORDER — OTEZLA 30 MG PO TABS: 1.0000 | ORAL_TABLET | Freq: Two times a day (BID) | ORAL | 0 refills | Status: DC

## 2020-05-27 NOTE — Telephone Encounter (Signed)
Refill request received via fax  Last Visit: 02/07/2020 Next Visit: 07/17/2020  Current Dose per office note on 02/07/2020: Otezla 30 mg 1 tablet twice daily  Okay to refill per Dr. Estanislado Pandy

## 2020-05-29 ENCOUNTER — Telehealth: Payer: Self-pay | Admitting: *Deleted

## 2020-05-29 NOTE — Telephone Encounter (Signed)
  COVID-19 vaccine recommendations:   COVID-19 vaccine is recommended for everyone (unless you are allergic to a vaccine component), even if you are on a medication that suppresses your immune system.   Patient advised she does not need to hold PLQ or Otezla.   Do not take Tylenol or any anti-inflammatory medications (NSAIDs) 24 hours prior to the COVID-19 vaccination.   There is no direct evidence about the efficacy of the COVID-19 vaccine in individuals who are on medications that suppress the immune system.   Even if you are fully vaccinated, and you are on any medications that suppress your immune system, please continue to wear a mask, maintain at least six feet social distance and practice hand hygiene.   If you develop a COVID-19 infection, please contact your PCP or our office to determine if you need antibody infusion.  The booster vaccine is now available for immunocompromised patients. It is advised that if you had Pfizer vaccine you should get Coca-Cola booster.  If you had a Moderna vaccine then you should get a Moderna booster. Johnson and Patricia Ray does not have a booster vaccine at this time.  Please see the following web sites for updated information.   https://[email or web address].org/Portals/0/Files/COVID-19-Vaccination-Patient-Resources.pdf  https://[email or web address]/About-Us/Newsroom/Press-Releases/ID/1159

## 2020-05-29 NOTE — Telephone Encounter (Signed)
Patient LMOM regarding booster vaccine. Please call patient. Thank you.

## 2020-07-03 NOTE — Progress Notes (Signed)
Office Visit Note  Patient: Patricia Ray             Date of Birth: Apr 27, 1971           MRN: [id number]             PCP: Sharyne Peach, MD Referring: Sharyne Peach, MD Visit Date: 07/17/2020 Occupation: @GUAROCC @  Subjective:  Medication monitoring   History of Present Illness: Patricia Ray is a 49 y.o. female with history of psoriatic arthritis, osteoarthritis, and discoid lupus.  She is taking plaquenil 200 mg 1 tablet by mouth twice daily and otezla 30 mg BID.  She is tolerating both medications without any side effects.  She denies any recent psoriatic arthritis flares.  She denies any joint pain or joint swelling at this time.  She states she has had discomfort in the morning due to plantar fasciitis of the right foot.  She has been seeing a podiatrist at [facility] in [place] and has tried wearing a brace.  She denies any achilles tendonitis.  She denies any SI joint pain.  She states her discomfort due to medial epicondylitis of both elbows has resolved. She denies any psoriasis or discoid lesions.   She denies any recent infections. She has received all 3 covid-19 vaccines.    Activities of Daily Living:  Patient reports morning stiffness for 0 minutes.   Patient Denies nocturnal pain.  Difficulty dressing/grooming: Denies Difficulty climbing stairs: Denies Difficulty getting out of chair: Denies Difficulty using hands for taps, buttons, cutlery, and/or writing: Denies  Review of Systems  Constitutional: Negative for fatigue.  HENT: Negative for mouth sores, mouth dryness and nose dryness.   Eyes: Positive for dryness. Negative for pain and visual disturbance.  Respiratory: Negative for cough, hemoptysis, shortness of breath and difficulty breathing.   Cardiovascular: Negative for chest pain, palpitations, hypertension and swelling in legs/feet.  Gastrointestinal: Negative for blood in stool, constipation and diarrhea.  Endocrine: Negative for  increased urination.  Genitourinary: Negative for painful urination.  Musculoskeletal: Negative for arthralgias, joint pain, joint swelling, myalgias, muscle weakness, morning stiffness, muscle tenderness and myalgias.  Skin: Negative for color change, pallor, rash, hair loss, nodules/bumps, skin tightness, ulcers and sensitivity to sunlight.  Allergic/Immunologic: Negative for susceptible to infections.  Neurological: Negative for dizziness, numbness, headaches and weakness.  Hematological: Negative for swollen glands.  Psychiatric/Behavioral: Negative for depressed mood and sleep disturbance. The patient is not nervous/anxious.     PMFS History:  Patient Active Problem List   Diagnosis Date Noted  . OSA (obstructive sleep apnea) 10/30/2018  . Primary osteoarthritis of both knees 08/23/2018  . Psoriatic arthritis ([place]) 05/10/2018  . Psoriasis of scalp 05/10/2018  . 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 High risk medication use 05/10/2018  . Essential hypertension 05/10/2018  . Other insomnia 05/10/2018  . Elevated prolactin level 05/10/2018  . Vitamin D insufficiency 05/10/2018  . Lupus erythematosus 04/11/2018  . Inflammatory polyarthropathy ([place]) 04/11/2018  . Renal stone 02/21/2016  . Notalgia 02/07/2016  . Increased prolactin level 03/19/2015    Past Medical History:  Diagnosis Date  . Arthritis    psoriatic arthritis  . Endometriosis   . GERD (gastroesophageal reflux disease)   . History of kidney stones   . Hypertension   . Psoriasis    scalp  . Sleep apnea    CPAP   . Systemic lupus erythematosus (HCC)     Family History  Problem Relation Age of Onset  . Hypercalcemia  Mother   . High Cholesterol Mother   . Colon cancer Maternal Grandmother   . Diabetes Father   . Bipolar disorder Sister   . Depression Sister   . Breast cancer Neg Hx    Past Surgical History:  Procedure Laterality Date  . BREAST BIOPSY Right 04/04/2018   BENIGN EPIDERMAL INCLUSION CYST   .  BUNIONECTOMY    . CESAREAN SECTION    . CYSTOSCOPY/URETEROSCOPY/HOLMIUM LASER/STENT PLACEMENT Left 07/19/2019   Procedure: CYSTOSCOPY/URETEROSCOPY/HOLMIUM LASER/STENT PLACEMENT, 1. Cystoscopy, left ureteroscopy and basket extraction of ureteral stone, retrograde pyelogram with intraoperative interpretation, left ureteral stent placement on Dang[place] ;  Surgeon: Billey Co, MD;  Location: [facility] ORS;  Service: Urology;  Laterality: Left;  . FOOT SURGERY    . REDUCTION MAMMAPLASTY  2005  . TONSILLECTOMY    . TUBAL LIGATION     Social History   Social History Narrative  . Not on file   Immunization History  Administered Date(s) Administered  . Influenza,inj,Quad PF,6+ Mos 09/16/2016  . Influenza,inj,quad, With Preservative 09/26/2016  . Influenza-Unspecified 07/12/2017, 07/12/2018  . PFIZER SARS-COV-2 Vaccination 01/05/2020, 01/29/2020, 06/11/2020  . Pneumococcal Polysaccharide-23 11/04/2016  . Tdap 11/04/2016     Objective: Vital Signs: BP 112/81 (BP Location: Left Arm, Patient Position: Sitting, Cuff Size: Normal)   Pulse 89   Resp 16   Ht 5' 2.5" (1.588 m)   Wt 187 lb (84.8 kg)   LMP 10/02/2014 Comment: denies preg  BMI 33.66 kg/m    Physical Exam Vitals and nursing note reviewed.  Constitutional:      Appearance: She is well-developed.  HENT:     Head: Normocephalic and atraumatic.  Eyes:     Conjunctiva/sclera: Conjunctivae normal.  Pulmonary:     Effort: Pulmonary effort is normal.  Abdominal:     Palpations: Abdomen is soft.  Musculoskeletal:     Cervical back: Normal range of motion.  Skin:    General: Skin is warm and dry.     Capillary Refill: Capillary refill takes less than 2 seconds.  Neurological:     Mental Status: She is alert and oriented to person, place, and time.  Psychiatric:        Behavior: Behavior normal.      Musculoskeletal Exam: C-spine, thoracic spine, and lumbar spine good ROM.  No midline spinal tenderness.  No SI joint  tenderness. Shoulder joints, elbow joints, wrist joints, MCPs, PIPs, and DIPs good ROM with no synovitis.  Complete fist formation bilaterally.  Hip joints, knee joints, and ankle joints good ROM with no discomfort.  No warmth or effusion of knee joints.  No tenderness or inflammation of ankle joints.   CDAI Exam: CDAI Score: -- Patient Global: --; Provider Global: -- Swollen: --; Tender: -- Joint Exam 07/17/2020   No joint exam has been documented for this visit   There is currently no information documented on the homunculus. Go to the Rheumatology activity and complete the homunculus joint exam.  Investigation: No additional findings.  Imaging: MM 3D SCREEN BREAST BILATERAL  Result Date: 07/09/2020 CLINICAL DATA:  Screening. EXAM: DIGITAL SCREENING BILATERAL MAMMOGRAM WITH TOMO AND CAD COMPARISON:  Previous exam(s). ACR Breast Density Category c: The breast tissue is heterogeneously dense, which may obscure small masses. FINDINGS: There are no findings suspicious for malignancy. Images were processed with CAD. IMPRESSION: No mammographic evidence of malignancy. A result letter of this screening mammogram will be mailed directly to the patient. RECOMMENDATION: Screening mammogram in one year. (Code:SM-B-01Y) BI-RADS CATEGORY  1: Negative. Electronically Signed   By: Nolon Nations M.D.   On: 07/09/2020 10:39    Recent Labs: Lab Results  Component Value Date   WBC 8.8 02/07/2020   HGB 15.5 02/07/2020   PLT 282 02/07/2020   NA 142 02/07/2020   K 4.7 02/07/2020   CL 107 02/07/2020   CO2 24 02/07/2020   GLUCOSE 97 02/07/2020   BUN 13 02/07/2020   CREATININE 0.77 02/07/2020   BILITOT 0.6 02/07/2020   ALKPHOS 55 07/12/2019   AST 14 02/07/2020   ALT 13 02/07/2020   PROT 7.0 02/07/2020   ALBUMIN 4.3 07/12/2019   CALCIUM 9.6 02/07/2020   GFRAA 104 02/07/2020   QFTBGOLDPLUS NEGATIVE 05/10/2018    Speciality Comments: PLQ eye exam: 03/13/2019 normal. [facility]. Follow up in 6  months. Prior therapy: MTX (allergy-hospitalized for 1 wk after taking MTX and simvastatin).  Procedures:  No procedures performed Allergies: Simvastatin, Methotrexate, and Nickel   Assessment / Plan:     Visit Diagnoses: Psoriatic arthropathy ([place]): She has no synovitis or dactylitis on exam.  She has not had any recent psoriatic arthritis flares.  She is clinically doing well on Plaquenil 200 mg 1 tablet by mouth twice daily and Otezla 30 mg 1 tablet by mouth twice daily.  She is tolerating both medications without any side effects.  She is not experiencing any joint pain, joint swelling, morning stiffness, or nocturnal pain at this time.  No SI joint tenderness on exam.  She was diagnosed with plantar fasciitis of the right foot at [facility] and has been using a brace which has been helpful. She has no achilles tendonitis at this time. She has no active psoriasis.  She will continue taking otezla 30 mg 1 tablet by mouth twice daily and Plaquenil 200 mg 1 tablet by mouth twice daily. She does not need refills at this time. She was advised to notify us if she develops signs or symptoms of a flare.  She will follow up in 5 months.   Psoriasis:  She has no active psoriasis at this time.   High risk medication use - Plaquenil 200 mg 1 tablet by mouth twice daily and Otezla 30 mg 1 tablet by mouth BID. eye exam: 03/13/2019.  She has had a more recent PLQ eye exam, so we will call [facility] care to obtain those records.  CBC and CMP were updated on 07/01/20.  Lab results were reviewed with the patient today in the office.    She has not had any recent infections.  She has received all 3 doses of the covid-19 vaccine.  She was encouraged to continue to wear a mask and social distance.  She was advised to notify us or her PCP if she develops the covid-19 infection in order to receive the antibody infusion.  She voiced understanding.   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  No discoid lesions noted on exam.  We discussed the importance of avoiding direct sun exposure and to wear sunscreen SPF >50 on a daily basis and reapply every 2 hours if she plans on being outside.  She will continue taking plaquenil 200 mg 1 tablet by mouth twice daily.  She has no clinical features of systemic lupus. She was advised to notify us if she develops symptoms of a flare.   Primary osteoarthritis of both knees: She has good ROM of both knee joints  with no discomfort.  Bilateral knee crepitus noted.  No warmth or effusion.  She has no difficulty climbing steps or getting up from a seated position.   Plantar fasciitis of right foot: She has tenderness along the plantar fascia of the right foot.  She was evaluated by a podiatrist at [facility] in [place] and was fitted for a brace, which has been improving her symptoms.  She was given a handout of exercises to perform.   Right hand paresthesia: Resolved   Medial epicondylitis of both elbows: Resolved.  She has no tenderness to palpation on exam.  She was advised to resume exercises if her symptoms recur.   Other medical conditions are listed as follows:   Renal stone  Essential hypertension  Vitamin D insufficiency  Other insomnia  Other fatigue  OSA (obstructive sleep apnea)  Orders: No orders of the defined types were placed in this encounter.  No orders of the defined types were placed in this encounter.    Follow-Up Instructions: Return in about 5 months (around 12/15/2020) for Psoriatic arthritis, Osteoarthritis.   Ofilia Neas, PA-C  Note - This record has been created using Dragon software.  Chart creation errors have been sought, but may not always  have been located. Such creation errors do not reflect on  the standard of medical care.

## 2020-07-08 ENCOUNTER — Other Ambulatory Visit: Payer: Self-pay | Admitting: Family Medicine

## 2020-07-08 DIAGNOSIS — Z1231 Encounter for screening mammogram for malignant neoplasm of breast: Secondary | ICD-10-CM

## 2020-07-09 ENCOUNTER — Ambulatory Visit
Admission: RE | Admit: 2020-07-09 | Discharge: 2020-07-09 | Disposition: A | Discharge status: Home / Self Care | Payer: 59 | Source: Ambulatory Visit | Attending: Family Medicine | Admitting: Family Medicine

## 2020-07-09 ENCOUNTER — Other Ambulatory Visit: Payer: Self-pay

## 2020-07-09 DIAGNOSIS — Z1231 Encounter for screening mammogram for malignant neoplasm of breast: Secondary | ICD-10-CM | POA: Diagnosis present

## 2020-07-17 ENCOUNTER — Encounter: Payer: Self-pay | Admitting: Physician Assistant

## 2020-07-17 ENCOUNTER — Other Ambulatory Visit: Payer: Self-pay

## 2020-07-17 ENCOUNTER — Ambulatory Visit (INDEPENDENT_AMBULATORY_CARE_PROVIDER_SITE_OTHER): Payer: 59 | Admitting: Physician Assistant

## 2020-07-17 VITALS — BP 112/81 | HR 89 | Resp 16 | Ht 62.5 in | Wt 187.0 lb

## 2020-07-17 DIAGNOSIS — I10 Essential (primary) hypertension: Secondary | ICD-10-CM

## 2020-07-17 DIAGNOSIS — R202 Paresthesia of skin: Secondary | ICD-10-CM

## 2020-07-17 DIAGNOSIS — M7701 Medial epicondylitis, right elbow: Secondary | ICD-10-CM

## 2020-07-17 DIAGNOSIS — Z79899 Other long term (current) drug therapy: Secondary | ICD-10-CM | POA: Diagnosis not present

## 2020-07-17 DIAGNOSIS — R5383 Other fatigue: Secondary | ICD-10-CM

## 2020-07-17 DIAGNOSIS — L930 Discoid lupus erythematosus: Secondary | ICD-10-CM | POA: Diagnosis not present

## 2020-07-17 DIAGNOSIS — G4733 Obstructive sleep apnea (adult) (pediatric): Secondary | ICD-10-CM

## 2020-07-17 DIAGNOSIS — M7702 Medial epicondylitis, left elbow: Secondary | ICD-10-CM

## 2020-07-17 DIAGNOSIS — E559 Vitamin D deficiency, unspecified: Secondary | ICD-10-CM

## 2020-07-17 DIAGNOSIS — L4050 Arthropathic psoriasis, unspecified: Secondary | ICD-10-CM

## 2020-07-17 DIAGNOSIS — M170 Bilateral primary osteoarthritis of knee: Secondary | ICD-10-CM

## 2020-07-17 DIAGNOSIS — N200 Calculus of kidney: Secondary | ICD-10-CM

## 2020-07-17 DIAGNOSIS — M722 Plantar fascial fibromatosis: Secondary | ICD-10-CM

## 2020-07-17 DIAGNOSIS — G4709 Other insomnia: Secondary | ICD-10-CM

## 2020-07-17 DIAGNOSIS — L409 Psoriasis, unspecified: Secondary | ICD-10-CM

## 2020-07-17 NOTE — Patient Instructions (Addendum)
COVID-19 vaccine recommendations:   COVID-19 vaccine is recommended for everyone (unless you are allergic to a vaccine component), even if you are on a medication that suppresses your immune system.   If you are on Methotrexate, Cellcept (mycophenolate), Rinvoq, Morrie Sheldon, and Olumiant- hold the medication for 1 week after each vaccine. Hold Methotrexate for 2 weeks after the single dose COVID-19 vaccine.   If you are on Orencia subcutaneous injection - hold medication one week prior to and one week after the first COVID-19 vaccine dose (only).   If you are on Orencia IV infusions- time vaccination administration so that the first COVID-19 vaccination will occur four weeks after the infusion and postpone the subsequent infusion by one week.   If you are on Cyclophosphamide or Rituxan infusions please contact your doctor prior to receiving the COVID-19 vaccine.   Do not take Tylenol or any anti-inflammatory medications (NSAIDs) 24 hours prior to the COVID-19 vaccination.   There is no direct evidence about the efficacy of the COVID-19 vaccine in individuals who are on medications that suppress the immune system.   Even if you are fully vaccinated, and you are on any medications that suppress your immune system, please continue to wear a mask, maintain at least six feet social distance and practice hand hygiene.   If you develop a COVID-19 infection, please contact your PCP or our office to determine if you need antibody infusion.  The booster vaccine is now available for immunocompromised patients. It is advised that if you had Pfizer vaccine you should get Coca-Cola booster.  If you had a Moderna vaccine then you should get a Moderna booster. Johnson and Wynetta Emery does not have a booster vaccine at this time.  Please see the following web sites for updated information.    https://www.[email or web address].org/Portals/0/Files/COVID-19-Vaccination-Patient-Resources.pdf  https://[email or web address]/About-Us/Newsroom/Press-Releases/ID/1159    Plantar Fasciitis Rehab Ask your health care provider which exercises are safe for you. Do exercises exactly as told by your health care provider and adjust them as directed. It is normal to feel mild stretching, pulling, tightness, or discomfort as you do these exercises. Stop right away if you feel sudden pain or your pain gets worse. Do not begin these exercises until told by your health care provider. Stretching and range-of-motion exercises These exercises warm up your muscles and joints and improve the movement and flexibility of your foot. These exercises also help to relieve pain. Plantar fascia stretch  1. Sit with your left / right leg crossed over your opposite knee. 2. Hold your heel with one hand with that thumb near your arch. With your other hand, hold your toes and gently pull them back toward the top of your foot. You should feel a stretch on the bottom of your toes or your foot (plantar fascia) or both. 3. Hold this stretch for__________ seconds. 4. Slowly release your toes and return to the starting position. Repeat __________ times. Complete this exercise __________ times a day. Gastrocnemius stretch, standing This exercise is also called a calf (gastroc) stretch. It stretches the muscles in the back of the upper calf. 1. Stand with your hands against a wall. 2. Extend your left / right leg behind you, and bend your front knee slightly. 3. Keeping your heels on the floor and your back knee straight, shift your weight toward the wall. Do not arch your back. You should feel a gentle stretch in your upper left / right calf. 4. Hold this position for __________ seconds. Repeat __________ times. Complete this exercise __________  times a day. Soleus stretch, standing This exercise is also called a calf (soleus)  stretch. It stretches the muscles in the back of the lower calf. 1. Stand with your hands against a wall. 2. Extend your left / right leg behind you, and bend your front knee slightly. 3. Keeping your heels on the floor, bend your back knee and shift your weight slightly over your back leg. You should feel a gentle stretch deep in your lower calf. 4. Hold this position for __________ seconds. Repeat __________ times. Complete this exercise __________ times a day. Gastroc and soleus stretch, standing step This exercise stretches the muscles in the back of the lower leg. These muscles are in the upper calf (gastrocnemius) and the lower calf (soleus). 1. Stand with the ball of your left / right foot on a step. The ball of your foot is on the walking surface, right under your toes. 2. Keep your other foot firmly on the same step. 3. Hold on to the wall or a railing for balance. 4. Slowly lift your other foot, allowing your body weight to press your left / right heel down over the edge of the step. You should feel a stretch in your left / right calf. 5. Hold this position for __________ seconds. 6. Return both feet to the step. 7. Repeat this exercise with a slight bend in your left / right knee. Repeat __________ times with your left / right knee straight and __________ times with your left / right knee bent. Complete this exercise __________ times a day. Balance exercise This exercise builds your balance and strength control of your arch to help take pressure off your plantar fascia. Single leg stand If this exercise is too easy, you can try it with your eyes closed or while standing on a pillow. 1. Without shoes, stand near a railing or in a doorway. You may hold on to the railing or door frame as needed. 2. Stand on your left / right foot. Keep your big toe down on the floor and try to keep your arch lifted. Do not let your foot roll inward. 3. Hold this position for __________ seconds. Repeat  __________ times. Complete this exercise __________ times a day. This information is not intended to replace advice given to you by your health care provider. Make sure you discuss any questions you have with your health care provider. Document Revised: 01/17/2019 Document Reviewed: 07/25/2018 Elsevier Patient Education  [place].

## 2020-08-12 ENCOUNTER — Other Ambulatory Visit: Payer: Self-pay | Admitting: *Deleted

## 2020-08-12 MED ORDER — HYDROXYCHLOROQUINE SULFATE 200 MG PO TABS
200.0000 mg | ORAL_TABLET | Freq: Two times a day (BID) | ORAL | 0 refills | Status: DC
Start: 2020-08-12 — End: 2021-02-08

## 2020-08-12 NOTE — Telephone Encounter (Signed)
Refill request received via fax  Last Visit: 07/17/2020 Next Visit: 12/16/2020 Labs:  07/01/20 Hgb 15.9, Hct 46.9 RBC 5.45, RDW 15.3 Eye exam: 03/13/2019 normal  Current Dose per office note 07/17/2020: plaquenil 200 mg 1 tablet by mouth twice daily DX: Psoriatic arthropathy   Spoke with patient and she states she has update her PLQ eye exam. Patient will contact the eye doctor to have them fax results.  Okay to refill Plaquenil?

## 2020-10-20 ENCOUNTER — Telehealth: Payer: Self-pay | Admitting: *Deleted

## 2020-10-20 NOTE — Telephone Encounter (Signed)
Received eye exam notes from [facility]. Faxed copy of our PLQ eye exam form for them to complete. Faxed form x 3 with no response.

## 2020-10-23 ENCOUNTER — Other Ambulatory Visit
Admission: RE | Admit: 2020-10-23 | Discharge: 2020-10-23 | Disposition: A | Discharge status: Home / Self Care | Payer: 59 | Source: Ambulatory Visit | Attending: Gastroenterology | Admitting: Gastroenterology

## 2020-10-23 ENCOUNTER — Encounter: Payer: Self-pay | Admitting: Internal Medicine

## 2020-10-23 ENCOUNTER — Other Ambulatory Visit: Payer: Self-pay

## 2020-10-23 DIAGNOSIS — Z20822 Contact with and (suspected) exposure to covid-19: Secondary | ICD-10-CM | POA: Diagnosis not present

## 2020-10-23 DIAGNOSIS — Z01812 Encounter for preprocedural laboratory examination: Secondary | ICD-10-CM | POA: Diagnosis not present

## 2020-10-23 LAB — SARS CORONAVIRUS 2 (TAT 6-24 HRS): SARS Coronavirus 2: NEGATIVE

## 2020-10-26 ENCOUNTER — Ambulatory Visit: Admission: RE | Admit: 2020-10-26 | Payer: 59 | Source: Home / Self Care | Admitting: Internal Medicine

## 2020-10-26 ENCOUNTER — Encounter: Admission: RE | Payer: Self-pay | Source: Home / Self Care

## 2020-10-26 SURGERY — COLONOSCOPY
Anesthesia: General

## 2020-11-24 ENCOUNTER — Telehealth: Payer: Self-pay

## 2020-11-24 NOTE — Telephone Encounter (Signed)
Patient needs to renew her handicap placard.  Patient requested a return call when it is ready to be picked up.

## 2020-11-24 NOTE — Telephone Encounter (Signed)
Patient left a voicemail stating she is getting re-certified for her FMLA.  Patient states her company sent the office a packet to be completed and returned.  Patient is requesting a return call.

## 2020-11-25 NOTE — Telephone Encounter (Signed)
Spoke with patient and advised er the handicap placard is ready for pick up. Also advised patient her FMLA paperwork has been completed and faxed. Patient advised she may pick up her copy at the front desk. Patient expressed understanding.

## 2020-11-26 ENCOUNTER — Other Ambulatory Visit: Payer: Self-pay

## 2020-11-26 ENCOUNTER — Other Ambulatory Visit
Admission: RE | Admit: 2020-11-26 | Discharge: 2020-11-26 | Disposition: A | Discharge status: Home / Self Care | Payer: 59 | Source: Ambulatory Visit | Attending: Internal Medicine | Admitting: Internal Medicine

## 2020-11-26 DIAGNOSIS — Z20822 Contact with and (suspected) exposure to covid-19: Secondary | ICD-10-CM | POA: Insufficient documentation

## 2020-11-26 DIAGNOSIS — Z01812 Encounter for preprocedural laboratory examination: Secondary | ICD-10-CM | POA: Insufficient documentation

## 2020-11-26 LAB — SARS CORONAVIRUS 2 (TAT 6-24 HRS): SARS Coronavirus 2: NEGATIVE

## 2020-11-27 ENCOUNTER — Encounter: Payer: Self-pay | Admitting: Internal Medicine

## 2020-11-30 ENCOUNTER — Encounter: Payer: Self-pay | Admitting: Internal Medicine

## 2020-11-30 ENCOUNTER — Other Ambulatory Visit: Payer: Self-pay

## 2020-11-30 ENCOUNTER — Encounter
Admission: RE | Disposition: A | Discharge status: Home / Self Care | Payer: Self-pay | Source: Home / Self Care | Attending: Internal Medicine

## 2020-11-30 ENCOUNTER — Ambulatory Visit: Payer: 59 | Admitting: Anesthesiology

## 2020-11-30 ENCOUNTER — Ambulatory Visit
Admission: RE | Admit: 2020-11-30 | Discharge: 2020-11-30 | Disposition: A | Discharge status: Home / Self Care | Payer: 59 | Attending: Internal Medicine | Admitting: Internal Medicine

## 2020-11-30 DIAGNOSIS — M064 Inflammatory polyarthropathy: Secondary | ICD-10-CM | POA: Insufficient documentation

## 2020-11-30 DIAGNOSIS — Z1211 Encounter for screening for malignant neoplasm of colon: Secondary | ICD-10-CM | POA: Diagnosis present

## 2020-11-30 DIAGNOSIS — Z888 Allergy status to other drugs, medicaments and biological substances status: Secondary | ICD-10-CM | POA: Insufficient documentation

## 2020-11-30 DIAGNOSIS — D120 Benign neoplasm of cecum: Secondary | ICD-10-CM | POA: Insufficient documentation

## 2020-11-30 DIAGNOSIS — Z79899 Other long term (current) drug therapy: Secondary | ICD-10-CM | POA: Insufficient documentation

## 2020-11-30 DIAGNOSIS — K640 First degree hemorrhoids: Secondary | ICD-10-CM | POA: Diagnosis not present

## 2020-11-30 DIAGNOSIS — Z9109 Other allergy status, other than to drugs and biological substances: Secondary | ICD-10-CM | POA: Diagnosis not present

## 2020-11-30 DIAGNOSIS — Z7951 Long term (current) use of inhaled steroids: Secondary | ICD-10-CM | POA: Diagnosis not present

## 2020-11-30 DIAGNOSIS — M329 Systemic lupus erythematosus, unspecified: Secondary | ICD-10-CM | POA: Diagnosis not present

## 2020-11-30 HISTORY — PX: COLONOSCOPY WITH PROPOFOL: SHX5780

## 2020-11-30 HISTORY — DX: Inflammatory polyarthropathy: M06.4

## 2020-11-30 HISTORY — DX: Insomnia, unspecified: G47.00

## 2020-11-30 HISTORY — DX: Dorsalgia, unspecified: M54.9

## 2020-11-30 HISTORY — DX: Vitamin D deficiency, unspecified: E55.9

## 2020-11-30 HISTORY — DX: Calculus of kidney: N20.0

## 2020-11-30 SURGERY — COLONOSCOPY WITH PROPOFOL
Anesthesia: General

## 2020-11-30 MED ORDER — PROPOFOL 500 MG/50ML IV EMUL
INTRAVENOUS | Status: DC | PRN
  Administered 2020-11-30: 130 ug/kg/min via INTRAVENOUS

## 2020-11-30 MED ORDER — PROPOFOL 10 MG/ML IV BOLUS
INTRAVENOUS | Status: DC | PRN
  Administered 2020-11-30: 100 mg via INTRAVENOUS

## 2020-11-30 MED ORDER — PROPOFOL 500 MG/50ML IV EMUL
INTRAVENOUS | Status: AC
  Filled 2020-11-30: qty 50

## 2020-11-30 MED ORDER — SODIUM CHLORIDE 0.9 % IV SOLN: INTRAVENOUS | Status: DC

## 2020-11-30 NOTE — Transfer of Care (Signed)
Immediate Anesthesia Transfer of Care Note  Patient: Patricia Ray  Procedure(s) Performed: COLONOSCOPY WITH PROPOFOL (N/A )  Patient Location: PACU and Endoscopy Unit  Anesthesia Type:General  Level of Consciousness: drowsy and patient cooperative  Airway & Oxygen Therapy: Patient Spontanous Breathing  Post-op Assessment: Report given to RN and Post -op Vital signs reviewed and stable  Post vital signs: Reviewed and stable  Last Vitals:  Vitals Value Taken Time  BP 126/83 11/30/20 1416  Temp    Pulse    Resp 20 11/30/20 1416  SpO2 98 % 11/30/20 1416    Last Pain:  Vitals:   11/30/20 1248  TempSrc: Temporal  PainSc: 0-No pain         Complications: No complications documented.

## 2020-11-30 NOTE — Op Note (Signed)
[facility] Gastroenterology Patient Name: Marielouise Amey Procedure Date: 11/30/2020 1:50 PM MRN: [id number] Account #: [id number] Date of Birth: 07-19-1971 Admit Type: Outpatient Age: 50 Room: [facility] END[facility] ROOM 2 Gender: Female Note Status: Finalized Procedure:             Colonoscopy Indications:           Screening for colorectal malignant neoplasm Providers:             Benay Pike. [name] MD, MD Referring MD:          Rubbie Battiest. Iona Beard MD, MD (Referring MD) Medicines:             Propofol per Anesthesia Complications:         No immediate complications. Procedure:             Pre-Anesthesia Assessment:                        - The risks and benefits of the procedure and the                         sedation options and risks were discussed with the                         patient. All questions were answered and informed                         consent was obtained.                        - Patient identification and proposed procedure were                         verified prior to the procedure by the nurse. The                         procedure was verified in the procedure room.                        - ASA Grade Assessment: III - A patient with severe                         systemic disease.                        - After reviewing the risks and benefits, the patient                         was deemed in satisfactory condition to undergo the                         procedure.                        After obtaining informed consent, the colonoscope was                         passed under direct vision. Throughout the procedure,                         the patient's blood  pressure, pulse, and oxygen                         saturations were monitored continuously. The                         Colonoscope was introduced through the anus and                         advanced to the the cecum, identified by appendiceal                         orifice and ileocecal  valve. The colonoscopy was                         performed without difficulty. The patient tolerated                         the procedure well. The quality of the bowel                         preparation was adequate. The ileocecal valve,                         appendiceal orifice, and rectum were photographed. Findings:      The perianal and digital rectal examinations were normal. Pertinent       negatives include normal sphincter tone and no palpable rectal lesions.      Non-bleeding internal hemorrhoids were found during retroflexion. The       hemorrhoids were Grade I (internal hemorrhoids that do not prolapse).      A 5 mm polyp was found in the cecum. The polyp was sessile. The polyp       was removed with a jumbo cold forceps. Resection and retrieval were       complete.      The exam was otherwise without abnormality. Impression:            - Non-bleeding internal hemorrhoids.                        - One 5 mm polyp in the cecum, removed with a jumbo                         cold forceps. Resected and retrieved.                        - The examination was otherwise normal. Recommendation:        - Patient has a contact number available for                         emergencies. The signs and symptoms of potential                         delayed complications were discussed with the patient.                         Return to normal activities tomorrow. Written  discharge instructions were provided to the patient.                        - Resume previous diet.                        - Continue present medications.                        - Repeat colonoscopy is recommended for surveillance.                         The colonoscopy date will be determined after                         pathology results from today's exam become available                         for review.                        - Return to GI office PRN.                        - The  findings and recommendations were discussed with                         the patient. Procedure Code(s):     --- Professional ---                        [phone], Colonoscopy, flexible; with biopsy, single or                         multiple Diagnosis Code(s):     --- Professional ---                        K64.0, First degree hemorrhoids                        K63.5, Polyp of colon                        Z12.11, Encounter for screening for malignant neoplasm                         of colon CPT copyright 2019 American Medical Association. All rights reserved. The codes documented in this report are preliminary and upon coder review may  be revised to meet current compliance requirements. Efrain Sella MD, MD 11/30/2020 2:15:31 PM This report has been signed electronically. Number of Addenda: 0 Note Initiated On: 11/30/2020 1:50 PM Scope Withdrawal Time: 0 hours 6 minutes 13 seconds  Total Procedure Duration: 0 hours 11 minutes 40 seconds  Estimated Blood Loss:  Estimated blood loss: none.      [facility]

## 2020-11-30 NOTE — Anesthesia Preprocedure Evaluation (Addendum)
Anesthesia Evaluation  Patient identified by MRN, date of birth, ID band Patient awake    Reviewed: Allergy & Precautions, NPO status , Patient's Chart, lab work & pertinent test results  History of Anesthesia Complications Negative for: history of anesthetic complications  Airway Mallampati: II  TM Distance: >3 FB Neck ROM: Full    Dental no notable dental hx.    Pulmonary sleep apnea and Continuous Positive Airway Pressure Ventilation , neg COPD, Current Smoker and Patient abstained from smoking.,    breath sounds clear to auscultation- rhonchi (-) wheezing      Cardiovascular hypertension, Pt. on medications (-) CAD, (-) Past MI, (-) Cardiac Stents and (-) CABG  Rhythm:Regular Rate:Normal - Systolic murmurs and - Diastolic murmurs    Neuro/Psych neg Seizures negative neurological ROS  negative psych ROS   GI/Hepatic Neg liver ROS, GERD  ,  Endo/Other  negative endocrine ROSneg diabetes  Renal/GU Renal disease (nephrolithiasis)     Musculoskeletal  (+) Arthritis , Osteoarthritis,    Abdominal (+) + obese,   Peds  Hematology negative hematology ROS (+)   Anesthesia Other Findings Past Medical History: No date: Arthritis     Comment:  psoriatic arthritis No date: Endometriosis No date: GERD (gastroesophageal reflux disease) No date: History of kidney stones No date: Hypertension No date: Lupus ([place]) No date: Psoriasis     Comment:  scalp No date: Sleep apnea     Comment:  CPAP    Reproductive/Obstetrics                             Anesthesia Physical  Anesthesia Plan  ASA: II  Anesthesia Plan: General   Post-op Pain Management:    Induction: Intravenous  PONV Risk Score and Plan: 1 and TIVA and Propofol infusion  Airway Management Planned: Nasal Cannula  Additional Equipment:   Intra-op Plan:   Post-operative Plan:   Informed Consent: I have reviewed the patients  History and Physical, chart, labs and discussed the procedure including the risks, benefits and alternatives for the proposed anesthesia with the patient or authorized representative who has indicated his/her understanding and acceptance.     Dental advisory given  Plan Discussed with: CRNA and Anesthesiologist  Anesthesia Plan Comments:        Anesthesia Quick Evaluation

## 2020-11-30 NOTE — Interval H&P Note (Signed)
History and Physical Interval Note:  11/30/2020 1:46 PM  Patricia Ray  has presented today for surgery, with the diagnosis of SCREEN.  The various methods of treatment have been discussed with the patient and family. After consideration of risks, benefits and other options for treatment, the patient has consented to  Procedure(s): COLONOSCOPY WITH PROPOFOL (N/A) as a surgical intervention.  The patient's history has been reviewed, patient examined, no change in status, stable for surgery.  I have reviewed the patient's chart and labs.  Questions were answered to the patient's satisfaction.     [place], [place]

## 2020-11-30 NOTE — H&P (Signed)
Outpatient short stay form Pre-procedure 11/30/2020 1:45 PM Patricia Ray Patricia Ray, M.D.  Primary Physician: Patricia Ray, M.D.  Reason for visit:  Colon cancer screening  History of present illness:  Patient presents for colonoscopy for colon cancer screening. The patient denies complaints of abdominal pain, significant change in bowel habits, or rectal bleeding.      Current Facility-Administered Medications:  .  0.9 %  sodium chloride infusion, , Intravenous, Continuous, Patricia, Benay Pike, MD, Last Rate: 20 mL/hr at 11/30/20 1301, New Bag at 11/30/20 1301  Medications Prior to Admission  Medication Sig Dispense Refill Last Dose  . hydroxychloroquine (PLAQUENIL) 200 MG tablet Take 1 tablet (200 mg total) by mouth 2 (two) times daily. 60 tablet 0 11/30/2020 at Unknown time  . OTEZLA 30 MG TABS Take 1 tablet (30 mg total) by mouth 2 (two) times daily. 180 tablet 0 11/30/2020 at Unknown time  . triamterene-hydrochlorothiazide (DYAZIDE) 37.5-25 MG capsule Take 1 capsule by mouth daily.   11/30/2020 at Unknown time  . albuterol (VENTOLIN HFA) 108 (90 Base) MCG/ACT inhaler Inhale 2 puffs into the lungs every 4 (four) hours as needed for wheezing or shortness of breath.     . ALPRAZolam (XANAX) 0.25 MG tablet Take 0.25 mg by mouth 2 (two) times daily as needed.     . ALPRAZolam (XANAX) 0.5 MG tablet Take 0.25-0.5 mg by mouth daily as needed.     . cetirizine (ZYRTEC) 10 MG tablet Take 10 mg by mouth as needed (Hives).  (Patient not taking: Reported on 11/27/2020)   Not Taking at Unknown time  . Cholecalciferol (VITAMIN D-3) 5000 units TABS Take 10,000 mg by mouth daily.      . Clobetasol Propionate 0.05 % shampoo Apply Topically onto dry scalp once daily in a thin film to affected areas only. Leave in place 15 minutes before lathering and rinsing. (Patient taking differently: Apply 1 application topically as needed. Apply Topically onto dry scalp once daily in a thin film to affected areas only. Leave  in place 15 minutes before lathering and rinsing.) 118 mL 1   . ergocalciferol (VITAMIN D2) 1.25 MG (50000 UT) capsule Vitamin D2 1,250 mcg (50,000 unit) capsule (Patient not taking: Reported on 07/17/2020)     . gentamicin cream (GARAMYCIN) 0.1 % Apply 1 application topically 2 (two) times daily. (Patient not taking: Reported on 11/27/2020) 30 g 1 Not Taking at Unknown time  . hydrocortisone 2.5 % ointment Apply 1 application topically 2 (two) times daily.     . hydroquinone 4 % cream Apply 1 application topically as needed (Lupus Breakout).      . ondansetron (ZOFRAN ODT) 4 MG disintegrating tablet Take 1 tablet (4 mg total) by mouth every 8 (eight) hours as needed for nausea or vomiting. (Patient not taking: Reported on 07/17/2020) 20 tablet 0   . oxyCODONE-acetaminophen (PERCOCET) 5-325 MG tablet Take 1 tablet by mouth every 4 (four) hours as needed for severe pain. 20 tablet 0   . Propylene Glycol (SYSTANE BALANCE) 0.6 % SOLN Place 1 drop into both eyes as needed (dry eyes).     Marland Kitchen sulfamethoxazole-trimethoprim (BACTRIM DS) 800-160 MG tablet Take 1 tablet by mouth daily. To prevent infection with stent in 4 tablet 0   . tacrolimus (PROTOPIC) 0.03 % ointment Apply topically 1-2 times daily to face and around eyes.        Allergies  Allergen Reactions  . Simvastatin Other (See Comments)    MTX and simvistatin cause multi system  failure  . Methotrexate Other (See Comments)    Per pt report she was hospitalized for a week due to an allergic reaction after taking methotrexate and simvastatin  . Nickel Swelling    Swelling and rash     Past Medical History:  Diagnosis Date  . Arthritis    psoriatic arthritis  . Endometriosis   . GERD (gastroesophageal reflux disease)   . History of kidney stones   . Hypertension   . Inflammatory polyarthropathy ([place])   . Insomnia   . Notalgia   . Psoriasis    scalp  . Renal stone   . Sleep apnea    CPAP   . Systemic lupus erythematosus ([place])   .  Vitamin D insufficiency     Review of systems:  Otherwise negative.    Physical Exam  Gen: Alert, oriented. Appears stated age.  HEENT: [place]/AT. PERRLA. Lungs: CTA, no wheezes. CV: RR nl S1, S2. Abd: soft, benign, no masses. BS+ Ext: No edema. Pulses 2+    Planned procedures: Proceed with colonoscopy. The patient understands the nature of the planned procedure, indications, risks, alternatives and potential complications including but not limited to bleeding, infection, perforation, damage to internal organs and possible oversedation/side effects from anesthesia. The patient agrees and gives consent to proceed.  Please refer to procedure notes for findings, recommendations and patient disposition/instructions.     Patricia Ray Patricia Ray, M.D. Gastroenterology 11/30/2020  1:45 PM

## 2020-11-30 NOTE — Anesthesia Postprocedure Evaluation (Signed)
Anesthesia Post Note  Patient: Patricia Ray  Procedure(s) Performed: COLONOSCOPY WITH PROPOFOL (N/A )  Patient location during evaluation: Phase II Anesthesia Type: General Level of consciousness: awake and alert, awake and oriented Pain management: pain level controlled Vital Signs Assessment: post-procedure vital signs reviewed and stable Respiratory status: spontaneous breathing, nonlabored ventilation and respiratory function stable Cardiovascular status: blood pressure returned to baseline and stable Postop Assessment: no apparent nausea or vomiting Anesthetic complications: no   No complications documented.   Last Vitals:  Vitals:   11/30/20 1426 11/30/20 1436  BP: (!) 141/96 (!) 127/95  Pulse: 93 83  Resp: 16 15  Temp:    SpO2: 98% 99%    Last Pain:  Vitals:   11/30/20 1436  TempSrc:   PainSc: 0-No pain                 Phill Mutter

## 2020-12-01 ENCOUNTER — Encounter: Payer: Self-pay | Admitting: Internal Medicine

## 2020-12-01 LAB — SURGICAL PATHOLOGY

## 2020-12-02 ENCOUNTER — Telehealth: Payer: Self-pay

## 2020-12-02 NOTE — Telephone Encounter (Signed)
Patient is fully immunized and had 3 doses of COVID-19 vaccine. She may consider getting a booster 6 months after the third vaccine. She is on [place] and Plaquenil which are not strong immunosuppressants. She should be able to return to work using all the precautions which include social distancing, hand hygiene and use of mask.

## 2020-12-02 NOTE — Telephone Encounter (Signed)
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 Estanislado Pandy feels it is safe to return.

## 2020-12-02 NOTE — Progress Notes (Deleted)
Office Visit Note  Patient: Patricia Ray             Date of Birth: 10-10-1971           MRN: [id number]             PCP: Sharyne Peach, MD Referring: Sharyne Peach, MD Visit Date: 12/16/2020 Occupation: @GUAROCC @  Subjective:  No chief complaint on file.   History of Present Illness: Patricia Ray is a 50 y.o. female ***   Activities of Daily Living:  Patient reports morning stiffness for *** {minute/hour:19697}.   Patient {ACTIONS;DENIES/REPORTS:21021675::"Denies"} nocturnal pain.  Difficulty dressing/grooming: {ACTIONS;DENIES/REPORTS:21021675::"Denies"} Difficulty climbing stairs: {ACTIONS;DENIES/REPORTS:21021675::"Denies"} Difficulty getting out of chair: {ACTIONS;DENIES/REPORTS:21021675::"Denies"} Difficulty using hands for taps, buttons, cutlery, and/or writing: {ACTIONS;DENIES/REPORTS:21021675::"Denies"}  No Rheumatology ROS completed.   PMFS History:  Patient Active Problem List   Diagnosis Date Noted  . OSA (obstructive sleep apnea) 10/30/2018  . Primary osteoarthritis of both knees 08/23/2018  . Psoriatic arthritis ([place]) 05/10/2018  . Psoriasis of scalp 05/10/2018  . 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 High risk medication use 05/10/2018  . Essential hypertension 05/10/2018  . Other insomnia 05/10/2018  . Elevated prolactin level 05/10/2018  . Vitamin D insufficiency 05/10/2018  . Lupus erythematosus 04/11/2018  . Inflammatory polyarthropathy ([place]) 04/11/2018  . Renal stone 02/21/2016  . Notalgia 02/07/2016  . Increased prolactin level 03/19/2015    Past Medical History:  Diagnosis Date  . Arthritis    psoriatic arthritis  . Endometriosis   . GERD (gastroesophageal reflux disease)   . History of kidney stones   . Hypertension   . Inflammatory polyarthropathy ([place])   . Insomnia   . Notalgia   . Psoriasis    scalp  . Renal stone   . Sleep apnea    CPAP   . Systemic lupus erythematosus ([place])   . Vitamin D insufficiency     Family History   Problem Relation Age of Onset  . Hypercalcemia Mother   . High Cholesterol Mother   . Colon cancer Maternal Grandmother   . Diabetes Father   . Bipolar disorder Sister   . Depression Sister   . Breast cancer Neg Hx    Past Surgical History:  Procedure Laterality Date  . BREAST BIOPSY Right 04/04/2018   BENIGN EPIDERMAL INCLUSION CYST   . BUNIONECTOMY    . CESAREAN SECTION    . COLONOSCOPY WITH PROPOFOL N/A 11/30/2020   Procedure: COLONOSCOPY WITH PROPOFOL;  Surgeon: [place], Benay Pike, MD;  Location: [facility] ENDOSCOPY;  Service: Gastroenterology;  Laterality: N/A;  . CYSTOSCOPY/URETEROSCOPY/HOLMIUM LASER/STENT PLACEMENT Left 07/19/2019   Procedure: CYSTOSCOPY/URETEROSCOPY/HOLMIUM LASER/STENT PLACEMENT, 1. Cystoscopy, left ureteroscopy and basket extraction of ureteral stone, retrograde pyelogram with intraoperative interpretation, left ureteral stent placement on [place] ;  Surgeon: Billey Co, MD;  Location: [facility] ORS;  Service: Urology;  Laterality: Left;  . FOOT SURGERY    . REDUCTION MAMMAPLASTY  2005  . TONSILLECTOMY    . TUBAL LIGATION     Social History   Social History Narrative  . Not on file   Immunization History  Administered Date(s) Administered  . Influenza,inj,Quad PF,6+ Mos 09/16/2016  . Influenza,inj,quad, With Preservative 09/26/2016  . Influenza-Unspecified 07/12/2017, 07/12/2018  . PFIZER(Purple Top)SARS-COV-2 Vaccination 01/05/2020, 01/29/2020, 06/11/2020  . Pneumococcal Polysaccharide-23 11/04/2016  . Tdap 11/04/2016     Objective: Vital Signs: LMP 10/02/2014 Comment: denies preg   Physical Exam   Musculoskeletal Exam: ***  CDAI Exam: CDAI Score: -- Patient Global: --; Provider Global: --  Swollen: --; Tender: -- Joint Exam 12/16/2020   No joint exam has been documented for this visit   There is currently no information documented on the homunculus. Go to the Rheumatology activity and complete the homunculus joint exam.  Investigation: No  additional findings.  Imaging: No results found.  Recent Labs: Lab Results  Component Value Date   WBC 8.8 02/07/2020   HGB 15.5 02/07/2020   PLT 282 02/07/2020   NA 142 02/07/2020   K 4.7 02/07/2020   CL 107 02/07/2020   CO2 24 02/07/2020   GLUCOSE 97 02/07/2020   BUN 13 02/07/2020   CREATININE 0.77 02/07/2020   BILITOT 0.6 02/07/2020   ALKPHOS 55 07/12/2019   AST 14 02/07/2020   ALT 13 02/07/2020   PROT 7.0 02/07/2020   ALBUMIN 4.3 07/12/2019   CALCIUM 9.6 02/07/2020   GFRAA 104 02/07/2020   QFTBGOLDPLUS NEGATIVE 05/10/2018    Speciality Comments: PLQ eye exam: 03/13/2019 normal. [facility]. Follow up in 6 months. Prior therapy: MTX (allergy-hospitalized for 1 wk after taking MTX and simvastatin).  Procedures:  No procedures performed Allergies: Simvastatin, Methotrexate, and Nickel   Assessment / Plan:     Visit Diagnoses: No diagnosis found.  Orders: No orders of the defined types were placed in this encounter.  No orders of the defined types were placed in this encounter.   Face-to-face time spent with patient was *** minutes. Greater than 50% of time was spent in counseling and coordination of care.  Follow-Up Instructions: No follow-ups on file.   Earnestine Mealing, CMA  Note - This record has been created using Editor, commissioning.  Chart creation errors have been sought, but may not always  have been located. Such creation errors do not reflect on  the standard of medical care.

## 2020-12-02 NOTE — Telephone Encounter (Signed)
She should be okay keeping 3 feet distance as long as she has a mask on.

## 2020-12-02 NOTE — Telephone Encounter (Signed)
Patient advised she should be okay keeping 3 feet distance as long as she has a mask on.

## 2020-12-02 NOTE — Telephone Encounter (Signed)
Verified with patient that she is fully immunized and had 3 doses of COVID-19 vaccine. Patient advised she may consider getting a booster 6 months after the third vaccine. Patient advised she is on [place] and Plaquenil which are not strong immunosuppressants. Patient advised she should be able to return to work using all the precautions which include social distancing, hand hygiene and use of mask.  Patient states she is unable to social distance 6 feet apart. Patient states the way her office is set up she is in a cubical and the person behind her is 2-3 feet behind her. Patient is concerned about the social distance. Please advise.

## 2020-12-04 ENCOUNTER — Telehealth: Payer: Self-pay | Admitting: Rheumatology

## 2020-12-04 NOTE — Telephone Encounter (Signed)
Patient would like to have a permanent handicap placard as before, and her paperwork was filled out for a temporary one. Please call to advise.

## 2020-12-04 NOTE — Telephone Encounter (Signed)
Patient advised her permanent parking placard is ready for pick up.

## 2020-12-04 NOTE — Telephone Encounter (Signed)
Okay to get the permanent handicap placard

## 2020-12-08 ENCOUNTER — Other Ambulatory Visit: Payer: Self-pay | Admitting: *Deleted

## 2020-12-08 DIAGNOSIS — L409 Psoriasis, unspecified: Secondary | ICD-10-CM

## 2020-12-08 MED ORDER — OTEZLA 30 MG PO TABS: 1.0000 | ORAL_TABLET | Freq: Two times a day (BID) | ORAL | 0 refills | Status: DC

## 2020-12-08 NOTE — Telephone Encounter (Signed)
Please get labs from her PCPs office.  Patient include CBC with differential and CMP with GFR.  We do not have labs since April 2021.  Need labs once a year

## 2020-12-08 NOTE — Telephone Encounter (Signed)
RF faxed from [place]  Last Visit: 07/17/2020 Next Visit: 12/16/2020  Current Dose per office note on 07/17/2020, otezla 30 mg 1 tablet by mouth twice daily Dx: Psoriatic arthropathy   Last Fill: 05/27/2020  Okay to refill [place]?

## 2020-12-09 NOTE — Telephone Encounter (Signed)
Labs are in [place] from 07/01/2020

## 2020-12-16 ENCOUNTER — Ambulatory Visit: Payer: 59 | Admitting: Rheumatology

## 2020-12-16 DIAGNOSIS — N200 Calculus of kidney: Secondary | ICD-10-CM

## 2020-12-16 DIAGNOSIS — G4733 Obstructive sleep apnea (adult) (pediatric): Secondary | ICD-10-CM

## 2020-12-16 DIAGNOSIS — L409 Psoriasis, unspecified: Secondary | ICD-10-CM

## 2020-12-16 DIAGNOSIS — M722 Plantar fascial fibromatosis: Secondary | ICD-10-CM

## 2020-12-16 DIAGNOSIS — L930 Discoid lupus erythematosus: Secondary | ICD-10-CM

## 2020-12-16 DIAGNOSIS — I10 Essential (primary) hypertension: Secondary | ICD-10-CM

## 2020-12-16 DIAGNOSIS — M7702 Medial epicondylitis, left elbow: Secondary | ICD-10-CM

## 2020-12-16 DIAGNOSIS — E559 Vitamin D deficiency, unspecified: Secondary | ICD-10-CM

## 2020-12-16 DIAGNOSIS — M7701 Medial epicondylitis, right elbow: Secondary | ICD-10-CM

## 2020-12-16 DIAGNOSIS — Z79899 Other long term (current) drug therapy: Secondary | ICD-10-CM

## 2020-12-16 DIAGNOSIS — L4050 Arthropathic psoriasis, unspecified: Secondary | ICD-10-CM

## 2020-12-16 DIAGNOSIS — R202 Paresthesia of skin: Secondary | ICD-10-CM

## 2020-12-16 DIAGNOSIS — G4709 Other insomnia: Secondary | ICD-10-CM

## 2020-12-16 DIAGNOSIS — R5383 Other fatigue: Secondary | ICD-10-CM

## 2020-12-21 NOTE — Progress Notes (Signed)
Office Visit Note  Patient: Patricia Ray             Date of Birth: [birth year]           MRN: [id number]             PCP: Sharyne Peach, MD Referring: Sharyne Peach, MD Visit Date: 12/23/2020 Occupation: @GUAROCC @  Subjective:  Medication monitoring   History of Present Illness: Patricia Ray is a 50 y.o. female with history of psoriatic arthritis, osteoarthritis, and discoid lupus.  Patient is taking Otezla 30 mg 1 tablet by mouth twice daily and Plaquenil 200 mg 1 tablet by mouth twice daily.  She denies any recent psoriatic arthritis flares.  She is not experiencing any joint pain or joint swelling at this time.  She denies any active psoriasis currently.  She has occasional patches on her scalp.  She denies any Achilles tendinitis or plantar fasciitis.  She denies any SI joint pain.  She denies any discoid lesions.  She wears sunscreen on a daily basis and avoid stroke sun exposure.  Patient reports that yesterday she started on aerobics and plans on starting to go 3 days a week for exercise.  Patient reports that she had a colonoscopy on 11/30/2020 and was found to have a polyp which was benign.     Activities of Daily Living:  Patient reports morning stiffness for 10 minutes.   Patient Denies nocturnal pain.  Difficulty dressing/grooming: Denies Difficulty climbing stairs: Denies Difficulty getting out of chair: Denies Difficulty using hands for taps, buttons, cutlery, and/or writing: Reports  Review of Systems  Constitutional: Negative for fatigue.  HENT: Negative for mouth sores, mouth dryness and nose dryness.   Eyes: Positive for itching and visual disturbance. Negative for pain and dryness.  Respiratory: Negative for cough, hemoptysis, shortness of breath and difficulty breathing.   Cardiovascular: Negative for chest pain, palpitations and swelling in legs/feet.  Gastrointestinal: Negative for abdominal pain, blood in stool, constipation and diarrhea.  Endocrine:  Negative for increased urination.  Genitourinary: Negative for painful urination.  Musculoskeletal: Positive for arthralgias, joint pain, joint swelling and morning stiffness. Negative for myalgias, muscle weakness, muscle tenderness and myalgias.  Skin: Positive for color change, rash and redness.  Allergic/Immunologic: Negative for susceptible to infections.  Neurological: Positive for numbness. Negative for dizziness, headaches, memory loss and weakness.  Hematological: Negative for swollen glands.  Psychiatric/Behavioral: Positive for sleep disturbance. Negative for confusion.    PMFS History:  Patient Active Problem List   Diagnosis Date Noted  . OSA (obstructive sleep apnea) 10/30/2018  . Primary osteoarthritis of both knees 08/23/2018  . Psoriatic arthritis ([place]) 05/10/2018  . Psoriasis of scalp 05/10/2018  . 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 High risk medication use 05/10/2018  . Essential hypertension 05/10/2018  . Other insomnia 05/10/2018  . Elevated prolactin level 05/10/2018  . Vitamin D insufficiency 05/10/2018  . Lupus erythematosus 04/11/2018  . Inflammatory polyarthropathy ([place]) 04/11/2018  . Renal stone 02/21/2016  . Notalgia 02/07/2016  . Increased prolactin level 03/19/2015    Past Medical History:  Diagnosis Date  . Arthritis    psoriatic arthritis  . Endometriosis   . GERD (gastroesophageal reflux disease)   . History of kidney stones   . Hypertension   . Inflammatory polyarthropathy ([place])   . Insomnia   . Notalgia   . Psoriasis    scalp  . Renal stone   . Sleep apnea    CPAP   . Systemic lupus  erythematosus ([place])   . Vitamin D insufficiency     Family History  Problem Relation Age of Onset  . Hypercalcemia Mother   . High Cholesterol Mother   . Colon cancer Maternal Grandmother   . Diabetes Father   . Bipolar disorder Sister   . Depression Sister   . Breast cancer Neg Hx    Past Surgical History:  Procedure Laterality Date  . BREAST BIOPSY  Right 04/04/2018   BENIGN EPIDERMAL INCLUSION CYST   . BUNIONECTOMY    . CESAREAN SECTION    . COLONOSCOPY WITH PROPOFOL N/A 11/30/2020   Procedure: COLONOSCOPY WITH PROPOFOL;  Surgeon: Toledo, Benay Pike, MD;  Location: [facility] ENDOSCOPY;  Service: Gastroenterology;  Laterality: N/A;  . CYSTOSCOPY/URETEROSCOPY/HOLMIUM LASER/STENT PLACEMENT Left 07/19/2019   Procedure: CYSTOSCOPY/URETEROSCOPY/HOLMIUM LASER/STENT PLACEMENT, 1. Cystoscopy, left ureteroscopy and basket extraction of ureteral stone, retrograde pyelogram with intraoperative interpretation, left ureteral stent placement on [place] ;  Surgeon: Billey Co, MD;  Location: [facility] ORS;  Service: Urology;  Laterality: Left;  . FOOT SURGERY    . REDUCTION MAMMAPLASTY  2005  . TONSILLECTOMY    . TUBAL LIGATION     Social History   Social History Narrative  . Not on file   Immunization History  Administered Date(s) Administered  . Influenza,inj,Quad PF,6+ Mos 09/16/2016  . Influenza,inj,quad, With Preservative 09/26/2016  . Influenza-Unspecified 07/12/2017, 07/12/2018  . PFIZER(Purple Top)SARS-COV-2 Vaccination 01/05/2020, 01/29/2020, 06/11/2020  . Pneumococcal Polysaccharide-23 11/04/2016  . Tdap 11/04/2016     Objective: Vital Signs: BP 122/80 (BP Location: Left Arm, Patient Position: Sitting, Cuff Size: Normal)   Pulse (!) 105   Ht 5' 2.5" (1.588 m)   Wt 200 lb 12.8 oz (91.1 kg)   LMP 10/02/2014 Comment: denies preg  BMI 36.14 kg/m    Physical Exam Vitals and nursing note reviewed.  Constitutional:      Appearance: She is well-developed.  HENT:     Head: Normocephalic and atraumatic.  Eyes:     Conjunctiva/sclera: Conjunctivae normal.  Pulmonary:     Effort: Pulmonary effort is normal.  Abdominal:     Palpations: Abdomen is soft.  Musculoskeletal:     Cervical back: Normal range of motion.  Skin:    General: Skin is warm and dry.     Capillary Refill: Capillary refill takes less than 2 seconds.  Neurological:      Mental Status: She is alert and oriented to person, place, and time.  Psychiatric:        Behavior: Behavior normal.      Musculoskeletal Exam: C-spine, thoracic spine, and lumbar spine good ROM.  No midline spinal tenderness.  No SI joint tenderness.   Shoulder joints, elbow joints, wrist joints, MCPs, PIPs, and DIPs good ROM.  Complete fist formation bilaterally.  Hip joints, knee joints, ankle joint, MTPs, PIPs, and DIPs have good range of motion with no discomfort.  No warmth or effusion of knee joints noted.  No tenderness or swelling of ankle joints.  No Achilles tendinitis or plantar fasciitis.  No tenderness of MTP joints.  CDAI Exam: CDAI Score: - Patient Global: -; Provider Global: - Swollen: -; Tender: - Joint Exam 12/23/2020   No joint exam has been documented for this visit   There is currently no information documented on the homunculus. Go to the Rheumatology activity and complete the homunculus joint exam.  Investigation: No additional findings.  Imaging: No results found.  Recent Labs: Lab Results  Component Value Date   WBC  8.8 02/07/2020   HGB 15.5 02/07/2020   PLT 282 02/07/2020   NA 142 02/07/2020   K 4.7 02/07/2020   CL 107 02/07/2020   CO2 24 02/07/2020   GLUCOSE 97 02/07/2020   BUN 13 02/07/2020   CREATININE 0.77 02/07/2020   BILITOT 0.6 02/07/2020   ALKPHOS 55 07/12/2019   AST 14 02/07/2020   ALT 13 02/07/2020   PROT 7.0 02/07/2020   ALBUMIN 4.3 07/12/2019   CALCIUM 9.6 02/07/2020   GFRAA 104 02/07/2020   QFTBGOLDPLUS NEGATIVE 05/10/2018    Speciality Comments: PLQ eye exam: 03/13/2019 normal. [facility]. Follow up in 6 months. Prior therapy: MTX (allergy-hospitalized for 1 wk after taking MTX and simvastatin).  Procedures:  No procedures performed Allergies: Simvastatin, Methotrexate, and Nickel   Assessment / Plan:     Visit Diagnoses: Psoriatic arthropathy ([place]): She has no synovitis or dactylitis on exam.  She has not had any  recent psoriatic arthritis flares.  She has no joint pain or inflammation at this time.  She has not had any Achilles tendinitis or plantar fasciitis.  She has no SI joint tenderness.  She has not been experiencing any nocturnal pain.  Her morning stiffness has been lasting about 10 minutes daily.  She started got aerobics yesterday and plans on going 3 days a week for exercise.  She is clinically doing well taking Otezla 30 mg 1 tablet by mouth twice daily and Plaquenil 200 mg 1 tablet by mouth twice daily.  She is tolerating both medications and has not missed any doses recently.  She will continue on the current treatment regimen.  She does not need any refills at this time.  She was advised to notify us if she develops increased joint pain or joint swelling.  Psoriasis: She has no active psoriasis at this time.  She has occasional small patches on her scalp, and she uses clobetasol propionate 0.05% shampoo topically as needed.    High risk medication use - Plaquenil 200 mg 1 tablet by mouth twice daily and Otezla 30 mg 1 tablet by mouth BID. CBC and CMP were drawn on 02/07/2020.  She is overdue to update lab work.  Orders for CBC and CMP were released.  She will continue to require lab work every 5 months to monitor for drug toxicity.  PLQ eye exam: 03/13/2019 normal. [facility]. Follow up in 6 months.  We will call [facility] to obtain the most recent Plaquenil eye exam on file. Prior therapy: MTX (allergy-hospitalized for 1 wk after taking MTX and simvastatin).- Plan: CBC with Differential/Platelet, COMPLETE METABOLIC PANEL WITH GFR  Discoid lupus: She has not had any discoid lesions recently.  Discussed the importance of avoiding direct sun exposure and wearing sunscreen SPF greater than 50 on a daily basis.  We also discussed the use of a widebrimmed hat.  She will continue taking Plaquenil as prescribed.  She was advised to notify us if she develops any discoid lesions.  Primary osteoarthritis of  both knees: She has good range of motion of both knee joints on exam.  No warmth or effusion was noted.  She experiences occasional discomfort in her knee joints usually with strenuous activity.  She requested a prescription for bilateral compression knee joint sleeves.   Plantar fasciitis of right foot: Resolved.  Right hand paresthesia: Resolved.  Medial epicondylitis of both elbows: Resolved.  Essential hypertension: Her blood pressure was 122/80 today.  Vitamin D insufficiency: She takes vitamin D 10,000  units daily.  Other insomnia: She has not been experiencing any nocturnal pain.  Other fatigue: She plans on going to water aerobics 3 days a week.  Other medical conditions are listed as follows:  OSA (obstructive sleep apnea)  Renal stone  Orders: Orders Placed This Encounter  Procedures  . CBC with Differential/Platelet  . COMPLETE METABOLIC PANEL WITH GFR   No orders of the defined types were placed in this encounter.    Follow-Up Instructions: Return in about 5 months (around 05/25/2021) for Psoriatic arthritis, Osteoarthritis.   Ofilia Neas, PA-C  Note - This record has been created using Dragon software.  Chart creation errors have been sought, but may not always  have been located. Such creation errors do not reflect on  the standard of medical care.

## 2020-12-23 ENCOUNTER — Encounter: Payer: Self-pay | Admitting: Physician Assistant

## 2020-12-23 ENCOUNTER — Other Ambulatory Visit: Payer: Self-pay

## 2020-12-23 ENCOUNTER — Ambulatory Visit (INDEPENDENT_AMBULATORY_CARE_PROVIDER_SITE_OTHER): Payer: 59 | Admitting: Physician Assistant

## 2020-12-23 VITALS — BP 122/80 | HR 105 | Ht 62.5 in | Wt 200.8 lb

## 2020-12-23 DIAGNOSIS — R5383 Other fatigue: Secondary | ICD-10-CM

## 2020-12-23 DIAGNOSIS — N200 Calculus of kidney: Secondary | ICD-10-CM

## 2020-12-23 DIAGNOSIS — G4733 Obstructive sleep apnea (adult) (pediatric): Secondary | ICD-10-CM

## 2020-12-23 DIAGNOSIS — Z79899 Other long term (current) drug therapy: Secondary | ICD-10-CM

## 2020-12-23 DIAGNOSIS — L409 Psoriasis, unspecified: Secondary | ICD-10-CM | POA: Diagnosis not present

## 2020-12-23 DIAGNOSIS — E559 Vitamin D deficiency, unspecified: Secondary | ICD-10-CM

## 2020-12-23 DIAGNOSIS — M170 Bilateral primary osteoarthritis of knee: Secondary | ICD-10-CM

## 2020-12-23 DIAGNOSIS — L930 Discoid lupus erythematosus: Secondary | ICD-10-CM | POA: Diagnosis not present

## 2020-12-23 DIAGNOSIS — M7701 Medial epicondylitis, right elbow: Secondary | ICD-10-CM

## 2020-12-23 DIAGNOSIS — M7702 Medial epicondylitis, left elbow: Secondary | ICD-10-CM

## 2020-12-23 DIAGNOSIS — G4709 Other insomnia: Secondary | ICD-10-CM

## 2020-12-23 DIAGNOSIS — L4050 Arthropathic psoriasis, unspecified: Secondary | ICD-10-CM | POA: Diagnosis not present

## 2020-12-23 DIAGNOSIS — M722 Plantar fascial fibromatosis: Secondary | ICD-10-CM

## 2020-12-23 DIAGNOSIS — R202 Paresthesia of skin: Secondary | ICD-10-CM

## 2020-12-23 DIAGNOSIS — I10 Essential (primary) hypertension: Secondary | ICD-10-CM

## 2020-12-23 LAB — CBC WITH DIFFERENTIAL/PLATELET
Absolute Monocytes: 959 cells/uL — ABNORMAL HIGH (ref 200–950)
Basophils Absolute: 49 cells/uL (ref 0–200)
Basophils Relative: 0.6 %
Eosinophils Absolute: 90 cells/uL (ref 15–500)
Eosinophils Relative: 1.1 %
HCT: 44.9 % (ref 35.0–45.0)
Hemoglobin: 15.1 g/dL (ref 11.7–15.5)
Lymphs Abs: 2411 cells/uL (ref 850–3900)
MCH: 29.5 pg (ref 27.0–33.0)
MCHC: 33.6 g/dL (ref 32.0–36.0)
MCV: 87.9 fL (ref 80.0–100.0)
MPV: 10.7 fL (ref 7.5–12.5)
Monocytes Relative: 11.7 %
Neutro Abs: 4690 cells/uL (ref 1500–7800)
Neutrophils Relative %: 57.2 %
Platelets: 262 10*3/uL (ref 140–400)
RBC: 5.11 10*6/uL — ABNORMAL HIGH (ref 3.80–5.10)
RDW: 13.2 % (ref 11.0–15.0)
Total Lymphocyte: 29.4 %
WBC: 8.2 10*3/uL (ref 3.8–10.8)

## 2020-12-23 LAB — COMPLETE METABOLIC PANEL WITH GFR
AG Ratio: 1.7 (calc) (ref 1.0–2.5)
ALT: 19 U/L (ref 6–29)
AST: 17 U/L (ref 10–35)
Albumin: 4 g/dL (ref 3.6–5.1)
Alkaline phosphatase (APISO): 61 U/L (ref 37–153)
BUN: 13 mg/dL (ref 7–25)
CO2: 25 mmol/L (ref 20–32)
Calcium: 9.3 mg/dL (ref 8.6–10.4)
Chloride: 105 mmol/L (ref 98–110)
Creat: 0.7 mg/dL (ref 0.50–1.05)
GFR, Est African American: 116 mL/min/{1.73_m2} (ref >=60)
GFR, Est Non African American: 100 mL/min/{1.73_m2} (ref >=60)
Globulin: 2.4 g/dL (calc) (ref 1.9–3.7)
Glucose, Bld: 72 mg/dL (ref 65–99)
Potassium: 3.8 mmol/L (ref 3.5–5.3)
Sodium: 140 mmol/L (ref 135–146)
Total Bilirubin: 0.4 mg/dL (ref 0.2–1.2)
Total Protein: 6.4 g/dL (ref 6.1–8.1)

## 2020-12-24 NOTE — Progress Notes (Signed)
RBC count is borderline.  Hgb and hct are WNL.  Absolute monocytes are borderline elevated. WBC count is WNL.  CMP WNL.

## 2021-01-05 ENCOUNTER — Telehealth: Payer: Self-pay

## 2021-01-05 MED ORDER — CLOBETASOL PROPIONATE 0.05 % EX SHAM: MEDICATED_SHAMPOO | CUTANEOUS | 1 refills | Status: DC

## 2021-01-05 NOTE — Telephone Encounter (Signed)
Patient is taking PLQ 1 tablet twice daily and Otezla one tablet twice daily. Please advise.

## 2021-01-05 NOTE — Telephone Encounter (Signed)
Ok to receive shingrix vaccine.

## 2021-01-05 NOTE — Telephone Encounter (Signed)
Patient left voicemail requesting prescription refill of her shampoo to be sent to [facility] in [place].

## 2021-01-05 NOTE — Telephone Encounter (Signed)
Patient called stating her PCP wants her to have the Shingles vaccine today at her office.  Patient states before she will give it to her, she wanted to check with Dr. Estanislado Pandy to make sure it won't affect the medications she is currently prescribing.  Please advise.

## 2021-01-05 NOTE — Telephone Encounter (Signed)
Next Visit:  05/31/2021  Last Visit: 12/23/2020  Last Fill: 07/09/2018  Dx: Psoriasis  Current Dose per office note on 12/23/2020, clobetasol propionate 0.05% shampoo topically as needed.    Okay to refill Clobetasol shampool?

## 2021-01-05 NOTE — Telephone Encounter (Signed)
I called patient,

## 2021-01-07 ENCOUNTER — Telehealth: Payer: Self-pay

## 2021-01-07 NOTE — Telephone Encounter (Signed)
Prior authorization initiated via [email or web address] for clobetasol shampoo.   Will update once we receive a response.   I spoke to patient and advised her of the situation. We offered sending in the clobetasol scalp oil and patient would like to wait for a response for the shampoo first.

## 2021-01-11 ENCOUNTER — Encounter: Payer: Self-pay | Admitting: Physician Assistant

## 2021-01-11 LAB — HM DIABETES EYE EXAM

## 2021-01-12 ENCOUNTER — Ambulatory Visit: Payer: 59 | Admitting: Podiatry

## 2021-01-14 ENCOUNTER — Other Ambulatory Visit: Payer: Self-pay

## 2021-01-14 MED ORDER — CLOBETASOL PROPIONATE 0.05 % EX SOLN: 1.0000 "application " | Freq: Two times a day (BID) | CUTANEOUS | 2 refills | Status: DC

## 2021-01-14 NOTE — Telephone Encounter (Signed)
Please review clobetasol scalp oil prescription and send to the pharmacy. PA was denied for clobetasol shampoo.

## 2021-01-14 NOTE — Telephone Encounter (Signed)
I called patient, patient will try the scalp oil.

## 2021-01-14 NOTE — Telephone Encounter (Signed)
Patient received notification that PA was denied for clobetasol shampoo. See telephone encounter from 01/14/2021.

## 2021-01-14 NOTE — Telephone Encounter (Signed)
Patient called stating she received an email letting her know the prior authorization for her Clobetasol Propionate shampoo was denied.  Patient requested a return call.

## 2021-02-05 ENCOUNTER — Other Ambulatory Visit: Payer: Self-pay | Admitting: Rheumatology

## 2021-02-08 NOTE — Telephone Encounter (Signed)
Last Visit: 12/23/2020 Next Visit: 05/31/2021 Labs: 12/23/2020, RBC count is borderline. Hgb and hct are WNL. Absolute monocytes are borderline elevated. WBC count is WNL. CMP WNL. Eye exam: 01/11/2021  Current Dose per office note 12/23/2020, Plaquenil 200 mg 1 tablet by mouth twice daily   DX: Psoriatic arthropathy   Last Fill: 08/12/2020  Okay to refill Plaquenil?

## 2021-02-11 ENCOUNTER — Telehealth: Payer: Self-pay

## 2021-02-11 NOTE — Telephone Encounter (Signed)
Advised patient There are no recommendations to work from home with immunosuppressive therapy at this time.  She may continue to wear a mask at work. Patient verbalized understanding.

## 2021-02-11 NOTE — Telephone Encounter (Signed)
There are no recommendations to work from home with immunosuppressive therapy at this time.  She may continue to wear a mask at work.

## 2021-02-11 NOTE — Telephone Encounter (Signed)
Patient has diagnosis of Psoriatic arthropathy, Psoriasis and Discoid lupus.    Meds: Plaquenil 200 mg 1 tablet by mouth twice daily and Otezla 30 mg 1 tablet by mouth BID.   Please advise patient's request to continue working from home. Thanks!

## 2021-02-11 NOTE — Telephone Encounter (Signed)
Patient called stating her office is going to make it mandatory to return to the office soon and patient is asking if Dr. Estanislado Pandy would be willing to fill out paperwork to allow her to keep working from home.  Patient states she is nervous due to the rise in Covid cases in [place].

## 2021-03-02 ENCOUNTER — Other Ambulatory Visit: Payer: Self-pay

## 2021-03-02 ENCOUNTER — Ambulatory Visit (INDEPENDENT_AMBULATORY_CARE_PROVIDER_SITE_OTHER): Payer: 59 | Admitting: Podiatry

## 2021-03-02 ENCOUNTER — Encounter: Payer: Self-pay | Admitting: Podiatry

## 2021-03-02 DIAGNOSIS — B351 Tinea unguium: Secondary | ICD-10-CM | POA: Diagnosis not present

## 2021-03-02 DIAGNOSIS — Z79899 Other long term (current) drug therapy: Secondary | ICD-10-CM

## 2021-03-02 DIAGNOSIS — M79675 Pain in left toe(s): Secondary | ICD-10-CM | POA: Diagnosis not present

## 2021-03-02 DIAGNOSIS — M79674 Pain in right toe(s): Secondary | ICD-10-CM | POA: Diagnosis not present

## 2021-03-02 MED ORDER — ITRACONAZOLE 100 MG PO CAPS: 100.0000 mg | ORAL_CAPSULE | Freq: Two times a day (BID) | ORAL | 1 refills | Status: DC

## 2021-03-02 NOTE — Progress Notes (Signed)
   Subjective: 50 y.o. female presenting today for follow-up evaluation of onychomycosis to the toenails bilateral.  She was last seen in 2020 and during the COVID pandemic and she went through for sessions of laser antifungal treatment modality which did not provide any improvement to her nails.  She also has tried oral Lamisil which caused headaches.  She presents for further treatment and evaluation  Past Medical History:  Diagnosis Date  . Arthritis    psoriatic arthritis  . Endometriosis   . GERD (gastroesophageal reflux disease)   . History of kidney stones   . Hypertension   . Inflammatory polyarthropathy ([place])   . Insomnia   . Notalgia   . Psoriasis    scalp  . Renal stone   . Sleep apnea    CPAP   . Systemic lupus erythematosus ([place])   . Vitamin D insufficiency     Objective: Physical Exam General: The patient is alert and oriented x3 in no acute distress.  Dermatology: Hyperkeratotic, discolored, thickened, onychodystrophy noted bilateral great toes as well as remaining digits. Skin is warm, dry and supple bilateral lower extremities. Negative for open lesions or macerations.  Vascular: Palpable pedal pulses bilaterally. No edema or erythema noted. Capillary refill within normal limits.  Neurological: Epicritic and protective threshold grossly intact bilaterally.   Musculoskeletal Exam: Range of motion within normal limits to all pedal and ankle joints bilateral. Muscle strength 5/5 in all groups bilateral.   Assessment: #1 Onychomycosis of toenails bilateral #2 Hyperkeratotic nails bilateral  Plan of Care:  #1 Patient was evaluated. #2  Today we discussed further treatment options.  The patient has now tried topical and laser treatment modalities without any improvement. #3 unfortunately Lamisil oral causes headaches.  We discussed other oral antifungal medications and the patient would like to pursue these options #4 prescription for Sporanox 100 mg 2 times  daily #90 with 1 refill #5 order placed for hepatic function panel #6 return to clinic in 6 months   Edrick Kins, DPM [facility]  Dr. Edrick Kins, DPM    [address], [place] [address]                Office [phone]  Fax [phone]

## 2021-03-03 ENCOUNTER — Telehealth: Payer: Self-pay

## 2021-03-03 LAB — HEPATIC FUNCTION PANEL
ALT: 19 IU/L (ref 0–32)
AST: 14 IU/L (ref 0–40)
Albumin: 4.4 g/dL (ref 3.8–4.9)
Alkaline Phosphatase: 66 IU/L (ref 44–121)
Bilirubin Total: 0.4 mg/dL (ref 0.0–1.2)
Bilirubin, Direct: 0.12 mg/dL (ref 0.00–0.40)
Total Protein: 6.9 g/dL (ref 6.0–8.5)

## 2021-03-03 MED ORDER — ITRACONAZOLE 100 MG PO CAPS: 100.0000 mg | ORAL_CAPSULE | Freq: Two times a day (BID) | ORAL | 1 refills | Status: DC

## 2021-03-03 NOTE — Telephone Encounter (Signed)
Patient's script has been sent to [facility]

## 2021-03-08 ENCOUNTER — Other Ambulatory Visit: Payer: Self-pay | Admitting: Rheumatology

## 2021-03-08 DIAGNOSIS — L409 Psoriasis, unspecified: Secondary | ICD-10-CM

## 2021-03-09 NOTE — Telephone Encounter (Signed)
Last Visit: 12/23/2020 Next Visit: 05/31/2021  Current Dose per office note on 12/23/2020: Otezla 30 mg 1 tablet by mouth twice daily   Okay to refill per Dr. Estanislado Pandy

## 2021-04-21 ENCOUNTER — Other Ambulatory Visit: Payer: Self-pay | Admitting: Physician Assistant

## 2021-04-21 NOTE — Telephone Encounter (Signed)
Last Visit: 12/23/2020  Next Visit: 05/31/2021  Labs: 01/05/2021 Hgb 16.0, Hct 48.2, RBC 5.55  Eye exam: 01/11/2021 normal   Current Dose per office note 12/23/2020: Plaquenil 200 mg 1 tablet by mouth twice daily  EE:[id number] arthropathy  Last Fill: 02/08/2021  Okay to refill Plaquenil?

## 2021-05-17 NOTE — Progress Notes (Signed)
Office Visit Note  Patient: Patricia Ray             Date of Birth: [birth year]           MRN: [id number]             PCP: Sharyne Peach, MD Referring: Sharyne Peach, MD Visit Date: 05/31/2021 Occupation: '@GUAROCC'$ @  Subjective:  Medication management.   History of Present Illness: Patricia Ray is a 50 y.o. female with a history of psoriasis, psoriatic arthritis and discoid lupus.  She denies any recent episode of joint pain or joint swelling.  She states psoriasis is quite well controlled.  She has occasional rash in the hairline.  She uses shampoo for that.  She denies any discomfort in her knees.  She has not had any recent episode of plantar fasciitis.  Right medial epicondylitis resolved.  She does a lot of computer work and have some paresthesias in her right hand off and on.  She had an episode of lower back pain and she was concerned that it may be related to kidney stone.  She also had an episode of ocular migraine.  She is planning to make an appointment with an ophthalmologist.  Activities of Daily Living:  Patient reports morning stiffness for 0 minutes.   Patient Denies nocturnal pain.  Difficulty dressing/grooming: Denies Difficulty climbing stairs: Denies Difficulty getting out of chair: Denies Difficulty using hands for taps, buttons, cutlery, and/or writing: Denies  Review of Systems  Constitutional:  Negative for fatigue.  HENT:  Negative for mouth sores, mouth dryness and nose dryness.   Eyes:  Negative for pain, itching and dryness.  Respiratory:  Negative for shortness of breath and difficulty breathing.   Cardiovascular:  Negative for chest pain and palpitations.  Gastrointestinal:  Negative for blood in stool, constipation and diarrhea.  Endocrine: Negative for increased urination.  Genitourinary:  Negative for difficulty urinating.  Musculoskeletal:  Positive for joint pain, joint pain and joint swelling. Negative for myalgias, morning stiffness,  muscle tenderness and myalgias.  Skin:  Negative for color change, rash, redness and sensitivity to sunlight.  Allergic/Immunologic: Negative for susceptible to infections.  Neurological:  Positive for numbness and parasthesias. Negative for dizziness, headaches and memory loss.  Hematological:  Positive for bruising/bleeding tendency.  Psychiatric/Behavioral:  Positive for sleep disturbance. Negative for depressed mood and confusion. The patient is nervous/anxious.    PMFS History:  Patient Active Problem List   Diagnosis Date Noted   OSA (obstructive sleep apnea) 10/30/2018   Primary osteoarthritis of both knees 08/23/2018   Psoriatic arthritis ([place]) 05/10/2018   Psoriasis of scalp 05/10/2018   Discoid lupus 05/10/2018   High risk medication use 05/10/2018   Essential hypertension 05/10/2018   Other insomnia 05/10/2018   Elevated prolactin level 05/10/2018   Vitamin D insufficiency 05/10/2018   Lupus erythematosus 04/11/2018   Inflammatory polyarthropathy ([place]) 04/11/2018   Renal stone 02/21/2016   Notalgia 02/07/2016   Increased prolactin level 03/19/2015    Past Medical History:  Diagnosis Date   Arthritis    psoriatic arthritis   Endometriosis    GERD (gastroesophageal reflux disease)    History of kidney stones    Hypertension    Inflammatory polyarthropathy (HCC)    Insomnia    Notalgia    Psoriasis    scalp   Renal stone    Sleep apnea    CPAP    Systemic lupus erythematosus (HCC)    Vitamin D insufficiency  Family History  Problem Relation Age of Onset   Hypercalcemia Mother    High Cholesterol Mother    Colon cancer Maternal Grandmother    Diabetes Father    Bipolar disorder Sister    Depression Sister    Breast cancer Neg Hx    Past Surgical History:  Procedure Laterality Date   BREAST BIOPSY Right 04/04/2018   BENIGN EPIDERMAL INCLUSION CYST    BUNIONECTOMY     CESAREAN SECTION     COLONOSCOPY WITH PROPOFOL N/A 11/30/2020   Procedure:  COLONOSCOPY WITH PROPOFOL;  Surgeon: Toledo, Benay Pike, MD;  Location: [facility] ENDOSCOPY;  Service: Gastroenterology;  Laterality: N/A;   CYSTOSCOPY/URETEROSCOPY/HOLMIUM LASER/STENT PLACEMENT Left 07/19/2019   Procedure: CYSTOSCOPY/URETEROSCOPY/HOLMIUM LASER/STENT PLACEMENT, 1. Cystoscopy, left ureteroscopy and basket extraction of ureteral stone, retrograde pyelogram with intraoperative interpretation, left ureteral stent placement on [place] ;  Surgeon: Billey Co, MD;  Location: [facility] ORS;  Service: Urology;  Laterality: Left;   FOOT SURGERY     REDUCTION MAMMAPLASTY  2005   TONSILLECTOMY     TUBAL LIGATION     Social History   Social History Narrative   Not on file   Immunization History  Administered Date(s) Administered   Influenza,inj,Quad PF,6+ Mos 09/16/2016, 08/07/2019, 07/01/2020   Influenza,inj,quad, With Preservative 09/26/2016   Influenza-Unspecified 09/16/2016, 07/12/2017, 07/12/2018   PFIZER(Purple Top)SARS-COV-2 Vaccination 01/05/2020, 01/29/2020, 06/11/2020, 01/05/2021   Pneumococcal Polysaccharide-23 11/04/2016, 10/25/2018   Tdap 11/04/2016     Objective: Vital Signs: BP 113/83 (BP Location: Left Arm, Patient Position: Sitting, Cuff Size: Large)   Pulse 96   Ht 5' 2.5" (1.588 m)   Wt 190 lb 12.8 oz (86.5 kg)   LMP 10/02/2014 Comment: denies preg  BMI 34.34 kg/m    Physical Exam Vitals and nursing note reviewed.  Constitutional:      Appearance: She is well-developed.  HENT:     Head: Normocephalic and atraumatic.  Eyes:     Conjunctiva/sclera: Conjunctivae normal.  Cardiovascular:     Rate and Rhythm: Normal rate and regular rhythm.     Heart sounds: Normal heart sounds.  Pulmonary:     Effort: Pulmonary effort is normal.     Breath sounds: Normal breath sounds.  Abdominal:     General: Bowel sounds are normal.     Palpations: Abdomen is soft.  Musculoskeletal:     Cervical back: Normal range of motion.  Lymphadenopathy:     Cervical: No  cervical adenopathy.  Skin:    General: Skin is warm and dry.     Capillary Refill: Capillary refill takes less than 2 seconds.  Neurological:     Mental Status: She is alert and oriented to person, place, and time.  Psychiatric:        Behavior: Behavior normal.     Musculoskeletal Exam: C-spine was in good range of motion.  Shoulder joints, elbow joints, wrist joints, MCPs PIPs and DIPs with good range of motion with no synovitis.  Hip joints, knee joints, ankles, MTPs and PIPs with good range of motion with no synovitis.  CDAI Exam: CDAI Score: -- Patient Global: --; Provider Global: -- Swollen: --; Tender: -- Joint Exam 05/31/2021   No joint exam has been documented for this visit   There is currently no information documented on the homunculus. Go to the Rheumatology activity and complete the homunculus joint exam.  Investigation: No additional findings.  Imaging: No results found.  Recent Labs: Lab Results  Component Value Date   WBC 8.2  12/23/2020   HGB 15.1 12/23/2020   PLT 262 12/23/2020   NA 140 12/23/2020   K 3.8 12/23/2020   CL 105 12/23/2020   CO2 25 12/23/2020   GLUCOSE 72 12/23/2020   BUN 13 12/23/2020   CREATININE 0.70 12/23/2020   BILITOT 0.4 03/02/2021   ALKPHOS 66 03/02/2021   AST 14 03/02/2021   ALT 19 03/02/2021   PROT 6.9 03/02/2021   ALBUMIN 4.4 03/02/2021   CALCIUM 9.3 12/23/2020   GFRAA 116 12/23/2020   QFTBGOLDPLUS NEGATIVE 05/10/2018    Speciality Comments: PLQ eye exam: 01/11/2021 normal. [facility]. Follow up in 6 months. Prior therapy: MTX (allergy-hospitalized for 1 wk after taking MTX and simvastatin).  Procedures:  No procedures performed Allergies: Simvastatin, Methotrexate, and Nickel   Assessment / Plan:     Visit Diagnoses: Psoriatic arthropathy (HCC)-patient had no synovitis on examination.  She has been tolerating Otezla and Plaquenil .  She denies any recent episodes of plantar fasciitis, Achilles tendinitis or  inflammation.  She denies any episodes of joint swelling.  Psoriasis-she gets occasional psoriasis lesions.  She had no active lesions today.  High risk medication use - Plaquenil 200 mg 1 tablet by mouth twice daily and Otezla 30 mg 1 tablet by mouth BID. PLQ eye exam: 01/11/2021 - Plan: CBC with Differential/Platelet, COMPLETE METABOLIC PANEL WITH GFR today and then every 5 months.  Her last eye examination was on January 11, 2021 by Dr. Gloriann Loan.  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  Use of sunscreen was discussed.  Bruising-she has a bruise on her left forearm.  I believe it may be due to Plaquenil use.  We will check labs today and we will contact her if there is any concern.  Increased risk of bruising with Plaquenil use was discussed.  Right hand paresthesia -complains of off-and-on paresthesias related to computer work.  Primary osteoarthritis of both knees-she denies any discomfort currently.  Lower extremity muscle strengthening exercises were discussed and a handout on knee joint exercises was given.  Essential hypertension-her blood pressure is well controlled.  Renal stone -she has been having few episodes of lower back pain which she believes are similar to her previous kidney stone episodes.  I advised her to schedule an appointment with a urologist.  Vitamin D insufficiency -she has history of vitamin D deficiency she has been taking vitamin D 5000 units a day.  I will check vitamin D level today.  I advised her to not to keep vitamin D very high with a history of kidney stones.  Plan: VITAMIN D 25 Hydroxy (Vit-D Deficiency, Fractures)  Other fatigue-she continues to have some fatigue  Other insomnia-insomnia has improved.  OSA (obstructive sleep apnea)-she has been using CPAP which helps.  Anxiety-she still have some anxiety due to previous relationship issues.  Orders: Orders Placed This Encounter  Procedures   CBC with Differential/Platelet   COMPLETE  METABOLIC PANEL WITH GFR   VITAMIN D 25 Hydroxy (Vit-D Deficiency, Fractures)    No orders of the defined types were placed in this encounter.    Follow-Up Instructions: Return in about 5 months (around 10/31/2021) for Psoriatic arthritis, DLE.   Bo Merino, MD  Note - This record has been created using Editor, commissioning.  Chart creation errors have been sought, but may not always  have been located. Such creation errors do not reflect on  the standard of medical care.

## 2021-05-18 ENCOUNTER — Telehealth: Payer: Self-pay

## 2021-05-18 NOTE — Telephone Encounter (Signed)
Elizabeth from [facility] left a voicemail stating they are trying to confirm the patient's primary physician.  Please call back at [phone].

## 2021-05-18 NOTE — Telephone Encounter (Signed)
Contacted Otezla support plus and verified patient sees Dr. Estanislado Pandy, verified our information here at the office.

## 2021-05-31 ENCOUNTER — Encounter: Payer: Self-pay | Admitting: Rheumatology

## 2021-05-31 ENCOUNTER — Ambulatory Visit (INDEPENDENT_AMBULATORY_CARE_PROVIDER_SITE_OTHER): Payer: 59 | Admitting: Rheumatology

## 2021-05-31 ENCOUNTER — Other Ambulatory Visit: Payer: Self-pay

## 2021-05-31 VITALS — BP 113/83 | HR 96 | Ht 62.5 in | Wt 190.8 lb

## 2021-05-31 DIAGNOSIS — L4050 Arthropathic psoriasis, unspecified: Secondary | ICD-10-CM

## 2021-05-31 DIAGNOSIS — T148XXA Other injury of unspecified body region, initial encounter: Secondary | ICD-10-CM

## 2021-05-31 DIAGNOSIS — N200 Calculus of kidney: Secondary | ICD-10-CM

## 2021-05-31 DIAGNOSIS — R5383 Other fatigue: Secondary | ICD-10-CM

## 2021-05-31 DIAGNOSIS — R202 Paresthesia of skin: Secondary | ICD-10-CM

## 2021-05-31 DIAGNOSIS — M722 Plantar fascial fibromatosis: Secondary | ICD-10-CM

## 2021-05-31 DIAGNOSIS — G4733 Obstructive sleep apnea (adult) (pediatric): Secondary | ICD-10-CM

## 2021-05-31 DIAGNOSIS — M170 Bilateral primary osteoarthritis of knee: Secondary | ICD-10-CM

## 2021-05-31 DIAGNOSIS — L930 Discoid lupus erythematosus: Secondary | ICD-10-CM | POA: Diagnosis not present

## 2021-05-31 DIAGNOSIS — F419 Anxiety disorder, unspecified: Secondary | ICD-10-CM

## 2021-05-31 DIAGNOSIS — E559 Vitamin D deficiency, unspecified: Secondary | ICD-10-CM

## 2021-05-31 DIAGNOSIS — Z79899 Other long term (current) drug therapy: Secondary | ICD-10-CM | POA: Diagnosis not present

## 2021-05-31 DIAGNOSIS — G4709 Other insomnia: Secondary | ICD-10-CM

## 2021-05-31 DIAGNOSIS — M7701 Medial epicondylitis, right elbow: Secondary | ICD-10-CM

## 2021-05-31 DIAGNOSIS — I10 Essential (primary) hypertension: Secondary | ICD-10-CM

## 2021-05-31 DIAGNOSIS — L409 Psoriasis, unspecified: Secondary | ICD-10-CM

## 2021-05-31 NOTE — Patient Instructions (Addendum)
Standing Labs We placed an order today for your standing lab work.   Please have your standing labs drawn in January  If possible, please have your labs drawn 2 weeks prior to your appointment so that the provider can discuss your results at your appointment.  Please note that you may see your imaging and lab results in [facility] before we have reviewed them. We may be awaiting multiple results to interpret others before contacting you. Please allow our office up to 72 hours to thoroughly review all of the results before contacting the office for clarification of your results.  We have open lab daily: Monday through Thursday from 1:30-4:30 PM and Friday from 1:30-4:00 PM at the office of Dr. Bo Merino, [facility].   Please be advised, all patients with office appointments requiring lab work will take precedent over walk-in lab work.  If possible, please come for your lab work on Monday and Friday afternoons, as you may experience shorter wait times. The office is located at [address], [place], [place],  [address] No appointment is necessary.   Labs are drawn by [facility]. Please bring your co-pay at the time of your lab draw.  You may receive a bill from [facility] for your lab work.  If you wish to have your labs drawn at another location, please call the office 24 hours in advance to send orders.  If you have any questions regarding directions or hours of operation,  please call [phone].   As a reminder, please drink plenty of water prior to coming for your lab work. Thanks!   Journal for Nurse Practitioners, 15(4), [phone]. Retrieved July 16, 2018 from http://clinicalkey.com/nursing">  Knee Exercises Ask your health care provider which exercises are safe for you. Do exercises exactly as told by your health care provider and adjust them as directed. It is normal to feel mild stretching, pulling, tightness, or discomfort as you do these exercises. Stop  right away if you feel sudden pain or your pain gets worse. Do not begin these exercises until told by your health care provider. Stretching and range-of-motion exercises These exercises warm up your muscles and joints and improve the movement and flexibility of your knee. These exercises also help to relieve pain andswelling. Knee extension, prone Lie on your abdomen (prone position) on a bed. Place your left / right knee just beyond the edge of the surface so your knee is not on the bed. You can put a towel under your left / right thigh just above your kneecap for comfort. Relax your leg muscles and allow gravity to straighten your knee (extension). You should feel a stretch behind your left / right knee. Hold this position for __________ seconds. Scoot up so your knee is supported between repetitions. Repeat __________ times. Complete this exercise __________ times a day. Knee flexion, active  Lie on your back with both legs straight. If this causes back discomfort, bend your left / right knee so your foot is flat on the floor. Slowly slide your left / right heel back toward your buttocks. Stop when you feel a gentle stretch in the front of your knee or thigh (flexion). Hold this position for __________ seconds. Slowly slide your left / right heel back to the starting position. Repeat __________ times. Complete this exercise __________ times a day. Quadriceps stretch, prone  Lie on your abdomen on a firm surface, such as a bed or padded floor. Bend your left / right knee and hold your  ankle. If you cannot reach your ankle or pant leg, loop a belt around your foot and grab the belt instead. Gently pull your heel toward your buttocks. Your knee should not slide out to the side. You should feel a stretch in the front of your thigh and knee (quadriceps). Hold this position for __________ seconds. Repeat __________ times. Complete this exercise __________ times a day. Hamstring, supine Lie on  your back (supine position). Loop a belt or towel over the ball of your left / right foot. The ball of your foot is on the walking surface, right under your toes. Straighten your left / right knee and slowly pull on the belt to raise your leg until you feel a gentle stretch behind your knee (hamstring). Do not let your knee bend while you do this. Keep your other leg flat on the floor. Hold this position for __________ seconds. Repeat __________ times. Complete this exercise __________ times a day. Strengthening exercises These exercises build strength and endurance in your knee. Endurance is theability to use your muscles for a long time, even after they get tired. Quadriceps, isometric This exercise stretches the muscles in front of your thigh (quadriceps) without moving your knee joint (isometric). Lie on your back with your left / right leg extended and your other knee bent. Put a rolled towel or small pillow under your knee if told by your health care provider. Slowly tense the muscles in the front of your left / right thigh. You should see your kneecap slide up toward your hip or see increased dimpling just above the knee. This motion will push the back of the knee toward the floor. For __________ seconds, hold the muscle as tight as you can without increasing your pain. Relax the muscles slowly and completely. Repeat __________ times. Complete this exercise __________ times a day. Straight leg raises This exercise stretches the muscles in front of your thigh (quadriceps) and the muscles that move your hips (hip flexors). Lie on your back with your left / right leg extended and your other knee bent. Tense the muscles in the front of your left / right thigh. You should see your kneecap slide up or see increased dimpling just above the knee. Your thigh may even shake a bit. Keep these muscles tight as you raise your leg 4-6 inches (10-15 cm) off the floor. Do not let your knee bend. Hold  this position for __________ seconds. Keep these muscles tense as you lower your leg. Relax your muscles slowly and completely after each repetition. Repeat __________ times. Complete this exercise __________ times a day. Hamstring, isometric Lie on your back on a firm surface. Bend your left / right knee about __________ degrees. Dig your left / right heel into the surface as if you are trying to pull it toward your buttocks. Tighten the muscles in the back of your thighs (hamstring) to "dig" as hard as you can without increasing any pain. Hold this position for __________ seconds. Release the tension gradually and allow your muscles to relax completely for __________ seconds after each repetition. Repeat __________ times. Complete this exercise __________ times a day. Hamstring curls If told by your health care provider, do this exercise while wearing ankle weights. Begin with __________ lb weights. Then increase the weight by 1 lb (0.5 kg) increments. Do not wear ankle weights that are more than __________ lb. Lie on your abdomen with your legs straight. Bend your left / right knee as far as you  can without feeling pain. Keep your hips flat against the floor. Hold this position for __________ seconds. Slowly lower your leg to the starting position. Repeat __________ times. Complete this exercise __________ times a day. Squats This exercise strengthens the muscles in front of your thigh and knee (quadriceps). Stand in front of a table, with your feet and knees pointing straight ahead. You may rest your hands on the table for balance but not for support. Slowly bend your knees and lower your hips like you are going to sit in a chair. Keep your weight over your heels, not over your toes. Keep your lower legs upright so they are parallel with the table legs. Do not let your hips go lower than your knees. Do not bend lower than told by your health care provider. If your knee pain increases,  do not bend as low. Hold the squat position for __________ seconds. Slowly push with your legs to return to standing. Do not use your hands to pull yourself to standing. Repeat __________ times. Complete this exercise __________ times a day. Wall slides This exercise strengthens the muscles in front of your thigh and knee (quadriceps). Lean your back against a smooth wall or door, and walk your feet out 18-24 inches (46-61 cm) from it. Place your feet hip-width apart. Slowly slide down the wall or door until your knees bend __________ degrees. Keep your knees over your heels, not over your toes. Keep your knees in line with your hips. Hold this position for __________ seconds. Repeat __________ times. Complete this exercise __________ times a day. Straight leg raises This exercise strengthens the muscles that rotate the leg at the hip and move it away from your body (hip abductors). Lie on your side with your left / right leg in the top position. Lie so your head, shoulder, knee, and hip line up. You may bend your bottom knee to help you keep your balance. Roll your hips slightly forward so your hips are stacked directly over each other and your left / right knee is facing forward. Leading with your heel, lift your top leg 4-6 inches (10-15 cm). You should feel the muscles in your outer hip lifting. Do not let your foot drift forward. Do not let your knee roll toward the ceiling. Hold this position for __________ seconds. Slowly return your leg to the starting position. Let your muscles relax completely after each repetition. Repeat __________ times. Complete this exercise __________ times a day. Straight leg raises This exercise stretches the muscles that move your hips away from the front of the pelvis (hip extensors). Lie on your abdomen on a firm surface. You can put a pillow under your hips if that is more comfortable. Tense the muscles in your buttocks and lift your left / right leg  about 4-6 inches (10-15 cm). Keep your knee straight as you lift your leg. Hold this position for __________ seconds. Slowly lower your leg to the starting position. Let your leg relax completely after each repetition. Repeat __________ times. Complete this exercise __________ times a day. This information is not intended to replace advice given to you by your health care provider. Make sure you discuss any questions you have with your healthcare provider. Document Revised: 07/17/2018 Document Reviewed: 07/17/2018 Elsevier Patient Education  2022 [employer].

## 2021-06-01 LAB — COMPLETE METABOLIC PANEL WITH GFR
AG Ratio: 1.6 (calc) (ref 1.0–2.5)
ALT: 15 U/L (ref 6–29)
AST: 14 U/L (ref 10–35)
Albumin: 4.2 g/dL (ref 3.6–5.1)
Alkaline phosphatase (APISO): 61 U/L (ref 37–153)
BUN: 16 mg/dL (ref 7–25)
CO2: 29 mmol/L (ref 20–32)
Calcium: 9.5 mg/dL (ref 8.6–10.4)
Chloride: 106 mmol/L (ref 98–110)
Creat: 0.87 mg/dL (ref 0.50–1.03)
Globulin: 2.6 g/dL (calc) (ref 1.9–3.7)
Glucose, Bld: 91 mg/dL (ref 65–99)
Potassium: 4.2 mmol/L (ref 3.5–5.3)
Sodium: 142 mmol/L (ref 135–146)
Total Bilirubin: 0.5 mg/dL (ref 0.2–1.2)
Total Protein: 6.8 g/dL (ref 6.1–8.1)
eGFR: 81 mL/min/{1.73_m2} (ref >=60)

## 2021-06-01 LAB — CBC WITH DIFFERENTIAL/PLATELET
Absolute Monocytes: 608 cells/uL (ref 200–950)
Basophils Absolute: 40 cells/uL (ref 0–200)
Basophils Relative: 0.5 %
Eosinophils Absolute: 79 cells/uL (ref 15–500)
Eosinophils Relative: 1 %
HCT: 48.8 % — ABNORMAL HIGH (ref 35.0–45.0)
Hemoglobin: 16.3 g/dL — ABNORMAL HIGH (ref 11.7–15.5)
Lymphs Abs: 2220 cells/uL (ref 850–3900)
MCH: 29.8 pg (ref 27.0–33.0)
MCHC: 33.4 g/dL (ref 32.0–36.0)
MCV: 89.2 fL (ref 80.0–100.0)
MPV: 10.5 fL (ref 7.5–12.5)
Monocytes Relative: 7.7 %
Neutro Abs: 4953 cells/uL (ref 1500–7800)
Neutrophils Relative %: 62.7 %
Platelets: 289 10*3/uL (ref 140–400)
RBC: 5.47 10*6/uL — ABNORMAL HIGH (ref 3.80–5.10)
RDW: 14.3 % (ref 11.0–15.0)
Total Lymphocyte: 28.1 %
WBC: 7.9 10*3/uL (ref 3.8–10.8)

## 2021-06-01 LAB — VITAMIN D 25 HYDROXY (VIT D DEFICIENCY, FRACTURES): Vit D, 25-Hydroxy: 51 ng/mL (ref 30–100)

## 2021-06-03 ENCOUNTER — Other Ambulatory Visit: Payer: Self-pay | Admitting: Rheumatology

## 2021-06-03 DIAGNOSIS — L409 Psoriasis, unspecified: Secondary | ICD-10-CM

## 2021-06-03 NOTE — Telephone Encounter (Signed)
Next Visit: 11/02/2021  Last Visit: 05/31/2021  Last Fill: 03/09/2021  DX: Psoriatic arthropathy   Current Dose per office note 05/31/2021: Rutherford Nail 30 mg 1 tablet by mouth BID  Labs: 05/31/2021 CMP WNL.  CBC stable.  Vitamin D WNL-51.   Okay to refill Rutherford Nail?

## 2021-07-08 ENCOUNTER — Other Ambulatory Visit: Payer: Self-pay | Admitting: Family Medicine

## 2021-07-08 DIAGNOSIS — Z1231 Encounter for screening mammogram for malignant neoplasm of breast: Secondary | ICD-10-CM

## 2021-07-13 ENCOUNTER — Telehealth: Payer: Self-pay | Admitting: *Deleted

## 2021-07-13 NOTE — Telephone Encounter (Signed)
Patient states she was seen at the eye doctor Tuesday 07/06/2021. Patient states she wanted to know if we have received the notes from the eye doctor. Patient advised we have not received them at this time. Patient states she will call the eye doctor again. Patient advised we will review notes once we receive them.

## 2021-07-20 ENCOUNTER — Other Ambulatory Visit: Payer: Self-pay | Admitting: Physician Assistant

## 2021-07-20 NOTE — Telephone Encounter (Signed)
Next Visit: 11/02/2021  Last Visit: 05/31/2021  Labs: CBB 07/07/2021 Hematocrit 45.7, RBC 5.19, RDW 15.2, 05/31/2021 CMP CMP WNL.  CBC stable.  Vitamin D WNL-51.   Eye exam: 01/11/2021 f/u 6 months, patient had PLQ eye exam last week, patient will have office refax results.  Current Dose per office note 05/31/2021: Plaquenil 200 mg 1 tablet by mouth twice daily  DX: Psoriatic arthropathy   Last Fill: 04/21/2021  Okay to refill Plaquenil?

## 2021-07-21 ENCOUNTER — Other Ambulatory Visit: Payer: Self-pay

## 2021-07-21 ENCOUNTER — Ambulatory Visit
Admission: RE | Admit: 2021-07-21 | Discharge: 2021-07-21 | Disposition: A | Discharge status: Home / Self Care | Payer: 59 | Source: Ambulatory Visit | Attending: Family Medicine | Admitting: Family Medicine

## 2021-07-21 DIAGNOSIS — Z1231 Encounter for screening mammogram for malignant neoplasm of breast: Secondary | ICD-10-CM | POA: Diagnosis not present

## 2021-08-18 ENCOUNTER — Other Ambulatory Visit: Payer: Self-pay

## 2021-08-18 ENCOUNTER — Ambulatory Visit (INDEPENDENT_AMBULATORY_CARE_PROVIDER_SITE_OTHER): Payer: 59 | Admitting: Urology

## 2021-08-18 ENCOUNTER — Encounter: Payer: Self-pay | Admitting: Urology

## 2021-08-18 VITALS — BP 108/76 | HR 98 | Ht 62.0 in | Wt 186.0 lb

## 2021-08-18 DIAGNOSIS — R1084 Generalized abdominal pain: Secondary | ICD-10-CM

## 2021-08-18 DIAGNOSIS — Z87442 Personal history of urinary calculi: Secondary | ICD-10-CM

## 2021-08-18 NOTE — Patient Instructions (Signed)
Please call radiology at [phone] to scheduled your scan

## 2021-08-18 NOTE — Progress Notes (Signed)
   08/18/2021 3:47 PM   Patricia Ray 1971/08/03 [id number]  Reason for visit: Possible kidney stone  HPI: 51 year old female who underwent left ureteroscopy and basket extraction of a 4 mm left distal ureteral stone in October 2020.  She reports a few months of bilateral intermittent low back pain that she feels could be related to a stone.  She feels this is different than her normal lupus and arthritis pain, and is interested in imaging to evaluate for kidney stones.  Urinalysis today is benign.  Will call with CT results  Billey Co, MD  [facility] [address], [place], [place] [address] [phone]

## 2021-08-19 LAB — URINALYSIS, COMPLETE
Bilirubin, UA: NEGATIVE
Glucose, UA: NEGATIVE
Leukocytes,UA: NEGATIVE
Nitrite, UA: NEGATIVE
Protein,UA: NEGATIVE
RBC, UA: NEGATIVE
Specific Gravity, UA: >1.03 — ABNORMAL HIGH (ref 1.005–1.030)
Urobilinogen, Ur: 0.2 mg/dL (ref 0.2–1.0)
pH, UA: 6 (ref 5.0–7.5)

## 2021-08-19 LAB — MICROSCOPIC EXAMINATION

## 2021-09-07 ENCOUNTER — Ambulatory Visit: Payer: 59 | Admitting: Podiatry

## 2021-09-13 ENCOUNTER — Ambulatory Visit: Payer: 59

## 2021-09-17 ENCOUNTER — Other Ambulatory Visit: Payer: Self-pay

## 2021-09-17 ENCOUNTER — Ambulatory Visit (INDEPENDENT_AMBULATORY_CARE_PROVIDER_SITE_OTHER): Payer: 59 | Admitting: Podiatry

## 2021-09-17 DIAGNOSIS — Z79899 Other long term (current) drug therapy: Secondary | ICD-10-CM | POA: Diagnosis not present

## 2021-09-17 DIAGNOSIS — B351 Tinea unguium: Secondary | ICD-10-CM

## 2021-09-17 DIAGNOSIS — M79674 Pain in right toe(s): Secondary | ICD-10-CM

## 2021-09-17 DIAGNOSIS — M79675 Pain in left toe(s): Secondary | ICD-10-CM | POA: Diagnosis not present

## 2021-09-17 MED ORDER — ITRACONAZOLE 100 MG PO CAPS: 100.0000 mg | ORAL_CAPSULE | Freq: Every day | ORAL | 0 refills | Status: DC

## 2021-09-17 NOTE — Progress Notes (Signed)
   HPI: 50 y.o. female presenting today for follow-up evaluation of onychomycosis of the toenails bilateral.  Patient states that her right foot is doing much better.  She does have some residual fungus to her left great toe.  She presents for follow-up treatment and evaluation.  She tolerated the Sporanox fine.  Past Medical History:  Diagnosis Date   Arthritis    psoriatic arthritis   Endometriosis    GERD (gastroesophageal reflux disease)    History of kidney stones    Hypertension    Inflammatory polyarthropathy (HCC)    Insomnia    Notalgia    Psoriasis    scalp   Renal stone    Sleep apnea    CPAP    Systemic lupus erythematosus ([place])    Vitamin D insufficiency      Physical Exam: General: The patient is alert and oriented x3 in no acute distress.  Dermatology: Skin is warm, dry and supple bilateral lower extremities. Negative for open lesions or macerations.  There is thickening and yellow discoloration of the toenails appears to be growing out nicely.  She has now been off of the Sporanox for about 3 months.  She would like to do 1 final round.  Vascular: Palpable pedal pulses bilaterally. No edema or erythema noted. Capillary refill within normal limits.  Neurological: Epicritic and protective threshold grossly intact bilaterally.   Musculoskeletal Exam: No pedal deformity  Assessment: 1.  Onychomycosis of toenails bilateral.  Mostly resolved except for the left hallux   Plan of Care:  1. Patient evaluated. 2.  An additional round of Sporanox 100 mg daily #90.  Prescribed and sent to the pharmacy. 3.  Complete metabolic panel taken 9/35/7017 within normal limits.  She denies a history of liver pathology or symptoms and she tolerated the Sporanox well last visit 4.  Return to clinic as needed      Edrick Kins, DPM [facility]  Dr. Edrick Kins, DPM    [address], [place] [address]                 Office [phone]  Fax [phone]

## 2021-10-11 ENCOUNTER — Other Ambulatory Visit: Payer: Self-pay

## 2021-10-11 ENCOUNTER — Emergency Department: Payer: 59

## 2021-10-11 ENCOUNTER — Other Ambulatory Visit: Payer: Self-pay | Admitting: Physician Assistant

## 2021-10-11 DIAGNOSIS — R109 Unspecified abdominal pain: Secondary | ICD-10-CM | POA: Diagnosis not present

## 2021-10-11 DIAGNOSIS — E860 Dehydration: Secondary | ICD-10-CM | POA: Insufficient documentation

## 2021-10-11 DIAGNOSIS — R112 Nausea with vomiting, unspecified: Secondary | ICD-10-CM | POA: Insufficient documentation

## 2021-10-11 DIAGNOSIS — R197 Diarrhea, unspecified: Secondary | ICD-10-CM | POA: Insufficient documentation

## 2021-10-11 DIAGNOSIS — R350 Frequency of micturition: Secondary | ICD-10-CM | POA: Insufficient documentation

## 2021-10-11 DIAGNOSIS — Z20822 Contact with and (suspected) exposure to covid-19: Secondary | ICD-10-CM | POA: Diagnosis not present

## 2021-10-11 DIAGNOSIS — L409 Psoriasis, unspecified: Secondary | ICD-10-CM

## 2021-10-11 LAB — CBC WITH DIFFERENTIAL/PLATELET
Abs Immature Granulocytes: 0.06 10*3/uL (ref 0.00–0.07)
Basophils Absolute: 0.1 10*3/uL (ref 0.0–0.1)
Basophils Relative: 0 %
Eosinophils Absolute: 0.1 10*3/uL (ref 0.0–0.5)
Eosinophils Relative: 1 %
HCT: 51.3 % — ABNORMAL HIGH (ref 36.0–46.0)
Hemoglobin: 17.6 g/dL — ABNORMAL HIGH (ref 12.0–15.0)
Immature Granulocytes: 0 %
Lymphocytes Relative: 16 %
Lymphs Abs: 2.7 10*3/uL (ref 0.7–4.0)
MCH: 29.4 pg (ref 26.0–34.0)
MCHC: 34.3 g/dL (ref 30.0–36.0)
MCV: 85.8 fL (ref 80.0–100.0)
Monocytes Absolute: 1.6 10*3/uL — ABNORMAL HIGH (ref 0.1–1.0)
Monocytes Relative: 10 %
Neutro Abs: 12.2 10*3/uL — ABNORMAL HIGH (ref 1.7–7.7)
Neutrophils Relative %: 73 %
Platelets: 339 10*3/uL (ref 150–400)
RBC: 5.98 MIL/uL — ABNORMAL HIGH (ref 3.87–5.11)
RDW: 15.2 % (ref 11.5–15.5)
WBC: 16.7 10*3/uL — ABNORMAL HIGH (ref 4.0–10.5)
nRBC: 0 % (ref 0.0–0.2)

## 2021-10-11 LAB — COMPREHENSIVE METABOLIC PANEL
ALT: 23 U/L (ref 0–44)
AST: 23 U/L (ref 15–41)
Albumin: 4.7 g/dL (ref 3.5–5.0)
Alkaline Phosphatase: 57 U/L (ref 38–126)
Anion gap: 10 (ref 5–15)
BUN: 18 mg/dL (ref 6–20)
CO2: 30 mmol/L (ref 22–32)
Calcium: 9.5 mg/dL (ref 8.9–10.3)
Chloride: 96 mmol/L — ABNORMAL LOW (ref 98–111)
Creatinine, Ser: 1.36 mg/dL — ABNORMAL HIGH (ref 0.44–1.00)
GFR, Estimated: 47 mL/min — ABNORMAL LOW (ref >60)
Glucose, Bld: 135 mg/dL — ABNORMAL HIGH (ref 70–99)
Potassium: 3.3 mmol/L — ABNORMAL LOW (ref 3.5–5.1)
Sodium: 136 mmol/L (ref 135–145)
Total Bilirubin: 1.4 mg/dL — ABNORMAL HIGH (ref 0.3–1.2)
Total Protein: 8.6 g/dL — ABNORMAL HIGH (ref 6.5–8.1)

## 2021-10-11 LAB — URINALYSIS, ROUTINE W REFLEX MICROSCOPIC
Bilirubin Urine: NEGATIVE
Glucose, UA: NEGATIVE mg/dL
Hgb urine dipstick: NEGATIVE
Ketones, ur: 5 mg/dL — AB
Leukocytes,Ua: NEGATIVE
Nitrite: NEGATIVE
Protein, ur: 30 mg/dL — AB
Specific Gravity, Urine: 1.01 (ref 1.005–1.030)
pH: 6 (ref 5.0–8.0)

## 2021-10-11 LAB — LIPASE, BLOOD: Lipase: 50 U/L (ref 11–51)

## 2021-10-11 NOTE — ED Triage Notes (Signed)
Pt in with co n.v.d since Saturday states has been persistent since. States has also had frequent urination. Had generalized abd pain and weakness.

## 2021-10-11 NOTE — ED Provider Notes (Signed)
Emergency Medicine Provider Triage Evaluation Note  Patricia Ray , a 51 y.o. female  was evaluated in triage.  Pt complains of abdominal pain, N/V/D.  Review of Systems  Positive: N/V/D Negative: Chest pain, shortness of breath  Physical Exam  BP (!) 140/96 (BP Location: Left Arm)    Pulse (!) 118    Temp 98.6 F (37 C) (Oral)    Resp 18    Ht 5\' 2"  (1.575 m)    Wt 81.2 kg    LMP 10/02/2014 Comment: denies preg   SpO2 97%    BMI 32.74 kg/m  Gen:   Awake, no distress   Resp:  Normal effort  MSK:   Moves extremities without difficulty  Other:    Medical Decision Making  Medically screening exam initiated at 11:15 PM.  Appropriate orders placed.  Patricia Ray was informed that the remainder of the evaluation will be completed by another provider, this initial triage assessment does not replace that evaluation, and the importance of remaining in the ED until their evaluation is complete.  51 year old female presenting with abdominal pain, nausea, vomiting and diarrhea.  Will obtain blood work, UA, CT imaging study while patient awaits treatment room.   Paulette Blanch, MD 10/11/21 [phone]

## 2021-10-12 ENCOUNTER — Emergency Department
Admission: EM | Admit: 2021-10-12 | Discharge: 2021-10-12 | Disposition: A | Discharge status: Home / Self Care | Payer: 59 | Attending: Emergency Medicine | Admitting: Emergency Medicine

## 2021-10-12 ENCOUNTER — Encounter: Payer: Self-pay | Admitting: Emergency Medicine

## 2021-10-12 DIAGNOSIS — N179 Acute kidney failure, unspecified: Secondary | ICD-10-CM

## 2021-10-12 DIAGNOSIS — E860 Dehydration: Secondary | ICD-10-CM

## 2021-10-12 LAB — RESP PANEL BY RT-PCR (FLU A&B, COVID) ARPGX2
Influenza A by PCR: NEGATIVE
Influenza B by PCR: NEGATIVE
SARS Coronavirus 2 by RT PCR: NEGATIVE

## 2021-10-12 MED ORDER — SODIUM CHLORIDE 0.9 % IV SOLN
1.0000 g | Freq: Once | INTRAVENOUS | Status: AC
  Administered 2021-10-12: 1 g via INTRAVENOUS
  Filled 2021-10-12: qty 10

## 2021-10-12 MED ORDER — SODIUM CHLORIDE 0.9 % IV BOLUS
1000.0000 mL | Freq: Once | INTRAVENOUS | Status: AC
  Administered 2021-10-12: 1000 mL via INTRAVENOUS

## 2021-10-12 MED ORDER — ONDANSETRON 4 MG PO TBDP: 4.0000 mg | ORAL_TABLET | Freq: Three times a day (TID) | ORAL | 0 refills | Status: DC | PRN

## 2021-10-12 MED ORDER — FAMOTIDINE 20 MG PO TABS
20.0000 mg | ORAL_TABLET | Freq: Once | ORAL | Status: AC
  Administered 2021-10-12: 20 mg via ORAL
  Filled 2021-10-12: qty 1

## 2021-10-12 MED ORDER — ONDANSETRON HCL 4 MG/2ML IJ SOLN
4.0000 mg | Freq: Once | INTRAMUSCULAR | Status: AC
Start: 2021-10-12 — End: 2021-10-12
  Administered 2021-10-12: 4 mg via INTRAVENOUS
  Filled 2021-10-12: qty 2

## 2021-10-12 MED ORDER — CEPHALEXIN 500 MG PO CAPS: 500.0000 mg | ORAL_CAPSULE | Freq: Three times a day (TID) | ORAL | 0 refills | Status: AC

## 2021-10-12 NOTE — ED Provider Notes (Signed)
[facility] Provider Note    Event Date/Time   First MD Initiated Contact with Patient 10/12/21 [phone]     (approximate)   History   Vomiting   HPI  Patricia Ray is a 51 y.o. female who presents to the ED for evaluation of Vomiting Review outpatient neurology visit from 11/9.  History of nephrolithiasis and ureteroscopy a couple years ago.  Patient presents to the ED for evaluation of nausea, emesis and diarrhea.  She reports "water from both ends" for the past 3 to 4 days..  She reports poor p.o. intake due to postprandial emesis.  Reports some epigastric abdominal discomfort and reports a history of GERD without controller medication, improved with home Tums.  In the setting of this, she reports explicit concern for dehydration and dry mucous membranes.  She reports increased urinary frequency and dribbling of her urine.  Physical Exam   Triage Vital Signs: ED Triage Vitals  Enc Vitals Group     BP 10/11/21 2228 (!) 140/96     Pulse Rate 10/11/21 2228 (!) 118     Resp 10/11/21 2228 18     Temp 10/11/21 2228 98.6 F (37 C)     Temp Source 10/11/21 2228 Oral     SpO2 10/11/21 2228 97 %     Weight 10/11/21 2228 179 lb (81.2 kg)     Height 10/11/21 2228 5\' 2"  (1.575 m)     Head Circumference --      Peak Flow --      Pain Score 10/11/21 2317 5     Pain Loc --      Pain Edu? --      Excl. in [place]? --     Most recent vital signs: Vitals:   10/12/21 0734 10/12/21 0743  BP:  (!) 138/92  Pulse: (!) 101 89  Resp:  18  Temp: 98 F (36.7 C) 98 F (36.7 C)  SpO2:  98%    General: Awake, no distress.  CV:  Good peripheral perfusion.  Resp:  Normal effort.  Abd:  No distention.  Minimal epigastric tenderness without peritoneal features or guarding.  No CVA tenderness bilaterally. Other:     ED Results / Procedures / Treatments   Labs (all labs ordered are listed, but only abnormal results are displayed) Labs Reviewed  CBC WITH  DIFFERENTIAL/PLATELET - Abnormal; Notable for the following components:      Result Value   WBC 16.7 (*)    RBC 5.98 (*)    Hemoglobin 17.6 (*)    HCT 51.3 (*)    Neutro Abs 12.2 (*)    Monocytes Absolute 1.6 (*)    All other components within normal limits  COMPREHENSIVE METABOLIC PANEL - Abnormal; Notable for the following components:   Potassium 3.3 (*)    Chloride 96 (*)    Glucose, Bld 135 (*)    Creatinine, Ser 1.36 (*)    Total Protein 8.6 (*)    Total Bilirubin 1.4 (*)    GFR, Estimated 47 (*)    All other components within normal limits  URINALYSIS, ROUTINE W REFLEX MICROSCOPIC - Abnormal; Notable for the following components:   Color, Urine YELLOW (*)    APPearance CLOUDY (*)    Ketones, ur 5 (*)    Protein, ur 30 (*)    Bacteria, UA MANY (*)    All other components within normal limits  RESP PANEL BY RT-PCR (FLU A&B, COVID) ARPGX2  URINE CULTURE  LIPASE, BLOOD    EKG    RADIOLOGY CT abdomen/pelvis obtained yesterday evening, reviewed by me without evidence of acute intra-abdominal pathology.  No signs of obstructing stones.  Official radiology report(s): CT Abdomen Pelvis Wo Contrast  Result Date: 10/11/2021 CLINICAL DATA:  Nausea and vomiting.  Abdominal pain. EXAM: CT ABDOMEN AND PELVIS WITHOUT CONTRAST TECHNIQUE: Multidetector CT imaging of the abdomen and pelvis was performed following the standard protocol without IV contrast. COMPARISON:  CT 07/12/2019 FINDINGS: Lower chest: No acute airspace disease or pleural effusion. The heart is normal in size. Trace pericardial fluid/effusion. Hepatobiliary: Minimal focal fatty infiltration adjacent to the falciform ligament. No suspicious liver lesion. Gallbladder physiologically distended, no calcified stone. No biliary dilatation. Pancreas: No ductal dilatation or inflammation. Spleen: Normal in size without focal abnormality. Adrenals/Urinary Tract: Stable 10 mm left adrenal nodule, unchanged from 2020 and likely  benign adenoma. Normal right adrenal gland. No hydronephrosis. Tiny punctate nonobstructing stone in the lower right kidney, series 5, image 40. No additional renal calculi. No perinephric edema. Both ureters are decompressed without stones along the course. Urinary bladder is near completely empty. No bladder stone. Stomach/Bowel: Stomach is partially distended. No small bowel obstruction or inflammation. Diminutive appendix is tentatively visualized, no appendicitis. Small volume of colonic stool. No colonic inflammation. Vascular/Lymphatic: Mild aortic atherosclerosis. No aortic aneurysm. No portal venous or mesenteric gas. No abdominopelvic adenopathy. Reproductive: Similar appearance of subcentimeter left birth lymph gland cyst. No associated inflammation. Uterus is otherwise unremarkable. Quiescent appearance of the ovaries. No adnexal mass. Other: No free air, free fluid, or intra-abdominal fluid collection. Tiny fat containing umbilical hernia. Musculoskeletal: Lower lumbar facet hypertrophy. There are no acute or suspicious osseous abnormalities. IMPRESSION: 1. No acute abnormality or explanation for abdominal pain. 2. Tiny punctate nonobstructing stone in the lower right kidney. Aortic Atherosclerosis (ICD10-I70.0). Electronically Signed   By: Keith Rake M.D.   On: 10/11/2021 23:55    PROCEDURES and INTERVENTIONS:  Procedures  Medications  sodium chloride 0.9 % bolus 1,000 mL (1,000 mLs Intravenous New Bag/Given 10/12/21 0838)  ondansetron (ZOFRAN) injection 4 mg (4 mg Intravenous Given 10/12/21 0839)  cefTRIAXone (ROCEPHIN) 1 g in sodium chloride 0.9 % 100 mL IVPB (1 g Intravenous New Bag/Given 10/12/21 0842)  famotidine (PEPCID) tablet 20 mg (20 mg Oral Given 10/12/21 0839)     IMPRESSION / MDM / ASSESSMENT AND PLAN / ED COURSE  I reviewed the triage vital signs and the nursing notes.  51 year old female presents to the ED with evidence of dehydration and AKI from likely gastroenteritis,  possible superimposed acute cystitis requiring antibiotic initiation, and ultimately agreeable to outpatient management.  Her symptoms sound most consistent with a gastroenteritis with minimal abdominal discomfort that may be due to GERD.  She has no lower abdominal pain, flank pain or fever to suggest sepsis or pyelonephritis.  Does have urinary frequency in the setting of dehydration, as well as dribbling urine, ultimately suspicious for acute cystitis in the setting of her leuk esterases on UA.  Shared decision-making yields plan to pursue treatment for acute cystitis with a few days of antibiotics.  Leukocytosis and erythrocytosis on CBC is noted, likely degree of dehydration and possibly reaction to acute infection, but again I doubt sepsis. CT without evidence of obstructing ureterolithiasis, gallstones, SBO.  No signs of pancreatitis.  With her creatinine elevated and ketonuria, suspect dehydration and AKI.  After 1 L of IV fluids she reports feeling much better and tachycardia has resolved.  Patient is suitable  for outpatient management with PCP follow-up and creatinine recheck.  Clinical Course as of 10/12/21 0937  Tue Oct 12, 2021  0935 Reassessed.  Patient standing in the room and dancing in place exclaiming that she needs to tinkle.  She reports feeling much better and she looks well to me.  [DS]    Clinical Course User Index [DS] Vladimir Crofts, MD     FINAL CLINICAL IMPRESSION(S) / ED DIAGNOSES   Final diagnoses:  Dehydration     Rx / DC Orders   ED Discharge Orders          Ordered    cephALEXin (KEFLEX) 500 MG capsule  3 times daily        10/12/21 0819    ondansetron (ZOFRAN-ODT) 4 MG disintegrating tablet  Every 8 hours PRN        10/12/21 0819             Note:  This document was prepared using Dragon voice recognition software and may include unintentional dictation errors.   Vladimir Crofts, MD 10/12/21 [phone]

## 2021-10-12 NOTE — Discharge Instructions (Addendum)
Use Tylenol for pain and fevers.  Up to 1000 mg per dose, up to 4 times per day.  Do not take more than 4000 mg of Tylenol/acetaminophen within 24 hours..  Pick up Pepcid/famotidine and start taking once daily for acid reflux control.  You be discharged with 2 prescriptions. Keflex antibiotics to take 3 times daily for the next 4 days to finish treating a UTI.  We gave you IV antibiotics today, so start taking oral antibiotics tomorrow/Wednesday.  Zofran to use as needed for nausea and vomiting.

## 2021-10-12 NOTE — Telephone Encounter (Signed)
Next Visit: 11/02/2021   Last Visit: 05/31/2021   Labs: CBC 07/07/2021 Hematocrit 45.7, RBC 5.19, RDW 15.2, 05/31/2021 CMP CMP WNL.  CBC stable.  Vitamin D WNL-51.   Current Dose per office note 05/31/2021:Otezla 30 mg 1 tablet by mouth BID  DX: Psoriatic arthropathy   Okay to refill [place] [facility]?

## 2021-10-14 LAB — URINE CULTURE

## 2021-10-18 ENCOUNTER — Other Ambulatory Visit: Payer: Self-pay | Admitting: Physician Assistant

## 2021-10-18 NOTE — Telephone Encounter (Signed)
Next Visit: 11/02/2021   Last Visit: 05/31/2021   Labs: 10/14/2021 Creat. 1.2, WBC 10.3, Hgb 17.0, Hct 47.2, RBC 5.54, RDW 14.7,    Eye exam: 07/08/2021 normal   Current Dose per office note 05/31/2021: Plaquenil 200 mg 1 tablet by mouth twice daily   DX: Psoriatic arthropathy    Last Fill: 07/20/2021   Okay to refill Plaquenil?

## 2021-10-19 ENCOUNTER — Telehealth: Payer: Self-pay | Admitting: Podiatry

## 2021-10-19 NOTE — Telephone Encounter (Signed)
Patient called she is on medication for fungus. Pt went to ER last weeknd and her liver function is up and her kidney function. Patient wants to talk to Dr Amalia Hailey about this. Patients number is [phone].

## 2021-10-19 NOTE — Telephone Encounter (Signed)
Spoke with patient. Discontinue sporanox. Lab markers not indicating liver compromise, however discontinue until recommended to resume by PCP. - Dr. Amalia Hailey

## 2021-10-19 NOTE — Progress Notes (Signed)
Office Visit Note  Patient: Patricia Ray             Date of Birth: 10-03-1971           MRN: [id number]             PCP: Sharyne Peach, MD Referring: Sharyne Peach, MD Visit Date: 11/02/2021 Occupation: @GUAROCC @  Subjective:  Medication monitoring   History of Present Illness: Patricia Ray is a 51 y.o. female with history of psoriatic arthritis, discoid lupus, and osteoarthritis. She is taking plaquenil 200 mg 1 tablet by mouth twice daily and otezla 30 mg 1 tablet by mouth twice daily.  She is tolerating these medications without any side effects.  She denies any signs or symptoms of a psoriatic arthritis flare.  She is not experiencing any joint pain or joint swelling at this time.  She denies any Achilles tendinitis or plantar fasciitis.  She is not experiencing any SI joint discomfort at this time.  She has no active psoriasis currently.  She denies any recent discoid lesions.  Patient reports that she was evaluated in the ED on 10/12/2021 due to experiencing vomiting and diarrhea.  She was found to be dehydrated and had acute kidney injury.  She followed up with her PCP on 10/14/2021 for further evaluation and management.  She states that she was found to have fatty liver disease as well as aortic atherosclerosis.  She has been working on weight loss and has increased her exercise regimen since that visit.     Activities of Daily Living:  Patient reports morning stiffness for 0 minutes.   Patient Denies nocturnal pain.  Difficulty dressing/grooming: Denies Difficulty climbing stairs: Denies Difficulty getting out of chair: Denies Difficulty using hands for taps, buttons, cutlery, and/or writing: Denies  Review of Systems  Constitutional:  Positive for fatigue.  HENT:  Negative for mouth sores, mouth dryness and nose dryness.   Eyes:  Positive for dryness. Negative for pain and itching.  Respiratory:  Negative for shortness of breath and difficulty breathing.    Cardiovascular:  Negative for chest pain and palpitations.  Gastrointestinal:  Negative for blood in stool, constipation and diarrhea.  Endocrine: Positive for increased urination.  Genitourinary:  Negative for difficulty urinating.  Musculoskeletal:  Negative for joint pain, joint pain, joint swelling, myalgias, morning stiffness, muscle tenderness and myalgias.  Skin:  Negative for color change, rash and redness.  Allergic/Immunologic: Negative for susceptible to infections.  Neurological:  Negative for dizziness, numbness, headaches, memory loss and weakness.  Hematological:  Positive for bruising/bleeding tendency.  Psychiatric/Behavioral:  Negative for confusion.    PMFS History:  Patient Active Problem List   Diagnosis Date Noted   OSA (obstructive sleep apnea) 10/30/2018   Primary osteoarthritis of both knees 08/23/2018   Psoriatic arthritis ([place]) 05/10/2018   Psoriasis of scalp 05/10/2018   Discoid lupus 05/10/2018   High risk medication use 05/10/2018   Essential hypertension 05/10/2018   Other insomnia 05/10/2018   Elevated prolactin level 05/10/2018   Vitamin D insufficiency 05/10/2018   Lupus erythematosus 04/11/2018   Inflammatory polyarthropathy ([place]) 04/11/2018   Renal stone 02/21/2016   Notalgia 02/07/2016   Increased prolactin level 03/19/2015    Past Medical History:  Diagnosis Date   Arthritis    psoriatic arthritis   Endometriosis    GERD (gastroesophageal reflux disease)    History of kidney stones    Hypertension    Inflammatory polyarthropathy (H[facility])    Insomnia  Notalgia    Psoriasis    scalp   Renal stone    Sleep apnea    CPAP    Systemic lupus erythematosus (HCC)    Vitamin D insufficiency     Family History  Problem Relation Age of Onset   Hypercalcemia Mother    High Cholesterol Mother    Colon cancer Maternal Grandmother    Diabetes Father    Bipolar disorder Sister    Depression Sister    Breast cancer Neg Hx    Past  Surgical History:  Procedure Laterality Date   BREAST BIOPSY Right 04/04/2018   BENIGN EPIDERMAL INCLUSION CYST    BUNIONECTOMY     CESAREAN SECTION     COLONOSCOPY WITH PROPOFOL N/A 11/30/2020   Procedure: COLONOSCOPY WITH PROPOFOL;  Surgeon: Toledo, Benay Pike, MD;  Location: [facility] END[facility]COPY;  Service: Gastroenterology;  Laterality: N/A;   CYSTOSCOPY/URETEROSCOPY/HOLMIUM LASER/STENT PLACEMENT Left 07/19/2019   Procedure: CYSTOSCOPY/URETEROSCOPY/HOLMIUM LASER/STENT PLACEMENT, 1. Cystoscopy, left ureteroscopy and basket extraction of ureteral stone, retrograde pyelogram with intraoperative interpretation, left ureteral stent placement on [place] ;  Surgeon: Billey Co, MD;  Location: [facility] ORS;  Service: Urology;  Laterality: Left;   FOOT SURGERY     REDUCTION MAMMAPLASTY  2005   TONSILLECTOMY     TUBAL LIGATION     Social History   Social History Narrative   Not on file   Immunization History  Administered Date(s) Administered   Influenza,inj,Quad PF,6+ Mos 09/16/2016, 08/07/2019, 07/01/2020   Influenza,inj,quad, With Preservative 09/26/2016   Influenza-Unspecified 09/16/2016, 07/12/2017, 07/12/2018   PFIZER(Purple Top)SARS-COV-2 Vaccination 01/05/2020, 01/29/2020, 06/11/2020   Pneumococcal Polysaccharide-23 11/04/2016, 10/25/2018   Tdap 11/04/2016     Objective: Vital Signs: BP 113/82 (BP Location: Left Arm, Patient Position: Sitting, Cuff Size: Normal)    Pulse (!) 105    Ht 5' 2.5" (1.588 m)    Wt 178 lb 6.4 oz (80.9 kg)    LMP 10/02/2014 Comment: denies preg   BMI 32.11 kg/m    Physical Exam Vitals and nursing note reviewed.  Constitutional:      Appearance: She is well-developed.  HENT:     Head: Normocephalic and atraumatic.  Eyes:     Conjunctiva/sclera: Conjunctivae normal.  Pulmonary:     Effort: Pulmonary effort is normal.  Abdominal:     Palpations: Abdomen is soft.  Musculoskeletal:     Cervical back: Normal range of motion.  Skin:    General: Skin is  warm and dry.     Capillary Refill: Capillary refill takes less than 2 seconds.  Neurological:     Mental Status: She is alert and oriented to person, place, and time.  Psychiatric:        Behavior: Behavior normal.     Musculoskeletal Exam: C-spine, thoracic spine, lumbar spine have good range of motion with no discomfort.  Shoulder joints, elbow joints, wrist joints, MCPs, PIPs, DIPs have good range of motion with no synovitis.  Complete fist formation bilaterally.  Hip joints have good range of motion with no groin pain.  Knee joints have good range of motion with no warmth or effusion.  Ankle joints have good range of motion with no tenderness or joint swelling.  No evidence of Achilles tendinitis.  CDAI Exam: CDAI Score: -- Patient Global: --; Provider Global: -- Swollen: --; Tender: -- Joint Exam 11/02/2021   No joint exam has been documented for this visit   There is currently no information documented on the homunculus. Go to the  Rheumatology activity and complete the homunculus joint exam.  Investigation: No additional findings.  Imaging: CT Abdomen Pelvis Wo Contrast  Result Date: 10/11/2021 CLINICAL DATA:  Nausea and vomiting.  Abdominal pain. EXAM: CT ABDOMEN AND PELVIS WITHOUT CONTRAST TECHNIQUE: Multidetector CT imaging of the abdomen and pelvis was performed following the standard protocol without IV contrast. COMPARISON:  CT 07/12/2019 FINDINGS: Lower chest: No acute airspace disease or pleural effusion. The heart is normal in size. Trace pericardial fluid/effusion. Hepatobiliary: Minimal focal fatty infiltration adjacent to the falciform ligament. No suspicious liver lesion. Gallbladder physiologically distended, no calcified stone. No biliary dilatation. Pancreas: No ductal dilatation or inflammation. Spleen: Normal in size without focal abnormality. Adrenals/Urinary Tract: Stable 10 mm left adrenal nodule, unchanged from 2020 and likely benign adenoma. Normal right adrenal  gland. No hydronephrosis. Tiny punctate nonobstructing stone in the lower right kidney, series 5, image 40. No additional renal calculi. No perinephric edema. Both ureters are decompressed without stones along the course. Urinary bladder is near completely empty. No bladder stone. Stomach/Bowel: Stomach is partially distended. No small bowel obstruction or inflammation. Diminutive appendix is tentatively visualized, no appendicitis. Small volume of colonic stool. No colonic inflammation. Vascular/Lymphatic: Mild aortic atherosclerosis. No aortic aneurysm. No portal venous or mesenteric gas. No abdominopelvic adenopathy. Reproductive: Similar appearance of subcentimeter left birth lymph gland cyst. No associated inflammation. Uterus is otherwise unremarkable. Quiescent appearance of the ovaries. No adnexal mass. Other: No free air, free fluid, or intra-abdominal fluid collection. Tiny fat containing umbilical hernia. Musculoskeletal: Lower lumbar facet hypertrophy. There are no acute or suspicious osseous abnormalities. IMPRESSION: 1. No acute abnormality or explanation for abdominal pain. 2. Tiny punctate nonobstructing stone in the lower right kidney. Aortic Atherosclerosis (ICD10-I70.0). Electronically Signed   By: Keith Rake M.D.   On: 10/11/2021 23:55    Recent Labs: Lab Results  Component Value Date   WBC 16.7 (H) 10/11/2021   HGB 17.6 (H) 10/11/2021   PLT 339 10/11/2021   NA 136 10/11/2021   K 3.3 (L) 10/11/2021   CL 96 (L) 10/11/2021   CO2 30 10/11/2021   GLUCOSE 135 (H) 10/11/2021   BUN 18 10/11/2021   CREATININE 1.36 (H) 10/11/2021   BILITOT 1.4 (H) 10/11/2021   ALKPHOS 57 10/11/2021   AST 23 10/11/2021   ALT 23 10/11/2021   PROT 8.6 (H) 10/11/2021   ALBUMIN 4.7 10/11/2021   CALCIUM 9.5 10/11/2021   GFRAA 116 12/23/2020   QFTBGOLDPLUS NEGATIVE 05/10/2018    Speciality Comments: PLQ eye exam: 07/08/2021 normal. [facility]. Follow up annually. Prior therapy: MTX  (allergy-hospitalized for 1 wk after taking MTX and simvastatin).  Procedures:  No procedures performed Allergies: Simvastatin, Methotrexate, and Nickel   Assessment / Plan:     Visit Diagnoses: Psoriatic arthropathy ([place]): She has no synovitis or dactylitis on examination today.  She has not had any signs or symptoms of a psoriatic arthritis flare.  She is clinically doing well taking Otezla 30 mg 1 tablet by mouth daily.  She is tolerating Otezla without any side effects.  She is not experiencing any joint pain, morning stiffness, nocturnal pain, or difficulty with ADLs at this time.  She has no active psoriasis at this time.  No Achilles denies or plantar fasciitis.  She has not had any SI joint discomfort or tenderness.  She will remain on Otezla as prescribed.  She is advised to notify us if she develops signs or symptoms of a flare.  She will follow-up in the  office in 5 months.  Psoriasis: She has no active psoriasis at this time.  She has occasional patches on her scalp.  She will remain on Otezla as prescribed.  High risk medication use - Plaquenil 200 mg 1 tablet by mouth twice daily and Otezla 30 mg 1 tablet by mouth BID. PLQ eye exam: 07/08/2021  - Plan: CBC with Differential/Platelet, COMPLETE METABOLIC PANEL WITH GFR CBC and CMP updated on 10/14/21.  Patient requested to have updated CBC and CMP today.  PLQ eye exam: 07/08/2021 normal. [facility]. Follow up annually. Association of heart disease with psoriatic arthritis was discussed. Need to monitor blood pressure, cholesterol, and to exercise 30-60 minutes on daily basis was discussed.  She has been going to the [facility] 3 days a week and has been changing her diet.  She has also increased her fluid intake.  Discussed the importance of regular exercise, good sleep hygiene, and stress management.  Discoid lupus: She has no discoid lesions at this time.  No recent flares.  Discussed the importance of avoiding direct sun exposure.  She will  remain on Plaquenil as prescribed.  She was advised to notify us if she develops any new or worsening symptoms.  Right hand paresthesia: Resolved  Primary osteoarthritis of both knees: She has good range of motion of both knee joints on examination today.  No warmth or effusion was noted.  Neck stiffness: She experiences intermittent neck stiffness especially first thing in the morning.  She has good ROM of the C-spine on examination today.  No symptoms of radiculopathy at this time.  Discussed the importance of finding a pillow that is supportive and comfortable.  She was given a handout of neck exercises to perform.  She was advised to notify us if her symptoms become more frequent or severe.    Other medical conditions are listed as follows:  Essential hypertension: Blood pressure is 113/82 today in the office.  Renal stone  Bruising  Other fatigue  Vitamin D insufficiency-Patient requested to have vitamin D level checked today.  Plan: VITAMIN D 25 Hydroxy (Vit-D Deficiency, Fractures)  OSA (obstructive sleep apnea)  Anxiety  Other insomnia  Urinary urgency -She has been experiencing some urinary urgency.  She requested to have a UA checked today.  Plan: Urinalysis, Routine w reflex microscopic  Screening cholesterol level - She requested to have lipid panel updated today.  Plan: Lipid panel  Orders: Orders Placed This Encounter  Procedures   CBC with Differential/Platelet   COMPLETE METABOLIC PANEL WITH GFR   VITAMIN D 25 Hydroxy (Vit-D Deficiency, Fractures)   Lipid panel   Urinalysis, Routine w reflex microscopic   No orders of the defined types were placed in this encounter.   Follow-Up Instructions: Return in about 5 months (around 04/02/2022) for Psoriatic arthritis, Osteoarthritis.   Ofilia Neas, PA-C  Note - This record has been created using Dragon software.  Chart creation errors have been sought, but may not always  have been located. Such creation  errors do not reflect on  the standard of medical care.

## 2021-10-20 ENCOUNTER — Telehealth: Payer: Self-pay

## 2021-10-20 NOTE — Telephone Encounter (Signed)
Patient called stating she was in the hospital on 10/12/21 due to nausea and diarrhea and had a lot of labwork.  Patient wanted to confirm Dr. Estanislado Pandy was able to review the results.

## 2021-10-20 NOTE — Telephone Encounter (Signed)
Patient advised we have seen her labs results. Patient advised that the providers have reviewed those labs and do not have any concerns at this time.

## 2021-10-27 ENCOUNTER — Telehealth: Payer: Self-pay

## 2021-10-27 NOTE — Telephone Encounter (Signed)
Patient called requesting a prescription of Chantix to be sent to [facility] in [place] to help her stop smoking.

## 2021-10-27 NOTE — Telephone Encounter (Signed)
Upon review of patient's chart, patient requested this from Atilano Median, MD. In the note from their office it states they sent it to the pharmacy. Advised patient.

## 2021-11-02 ENCOUNTER — Other Ambulatory Visit: Payer: Self-pay

## 2021-11-02 ENCOUNTER — Ambulatory Visit (INDEPENDENT_AMBULATORY_CARE_PROVIDER_SITE_OTHER): Payer: 59 | Admitting: Physician Assistant

## 2021-11-02 ENCOUNTER — Encounter: Payer: Self-pay | Admitting: Physician Assistant

## 2021-11-02 VITALS — BP 113/82 | HR 105 | Ht 62.5 in | Wt 178.4 lb

## 2021-11-02 DIAGNOSIS — G4709 Other insomnia: Secondary | ICD-10-CM

## 2021-11-02 DIAGNOSIS — L930 Discoid lupus erythematosus: Secondary | ICD-10-CM

## 2021-11-02 DIAGNOSIS — L409 Psoriasis, unspecified: Secondary | ICD-10-CM | POA: Diagnosis not present

## 2021-11-02 DIAGNOSIS — I10 Essential (primary) hypertension: Secondary | ICD-10-CM

## 2021-11-02 DIAGNOSIS — Z13220 Encounter for screening for lipoid disorders: Secondary | ICD-10-CM

## 2021-11-02 DIAGNOSIS — R3915 Urgency of urination: Secondary | ICD-10-CM

## 2021-11-02 DIAGNOSIS — L4050 Arthropathic psoriasis, unspecified: Secondary | ICD-10-CM | POA: Diagnosis not present

## 2021-11-02 DIAGNOSIS — E559 Vitamin D deficiency, unspecified: Secondary | ICD-10-CM

## 2021-11-02 DIAGNOSIS — Z79899 Other long term (current) drug therapy: Secondary | ICD-10-CM

## 2021-11-02 DIAGNOSIS — R5383 Other fatigue: Secondary | ICD-10-CM

## 2021-11-02 DIAGNOSIS — T148XXA Other injury of unspecified body region, initial encounter: Secondary | ICD-10-CM

## 2021-11-02 DIAGNOSIS — N200 Calculus of kidney: Secondary | ICD-10-CM

## 2021-11-02 DIAGNOSIS — R202 Paresthesia of skin: Secondary | ICD-10-CM

## 2021-11-02 DIAGNOSIS — F419 Anxiety disorder, unspecified: Secondary | ICD-10-CM

## 2021-11-02 DIAGNOSIS — M436 Torticollis: Secondary | ICD-10-CM

## 2021-11-02 DIAGNOSIS — G4733 Obstructive sleep apnea (adult) (pediatric): Secondary | ICD-10-CM

## 2021-11-02 DIAGNOSIS — M170 Bilateral primary osteoarthritis of knee: Secondary | ICD-10-CM

## 2021-11-02 NOTE — Patient Instructions (Signed)
Neck Exercises Ask your health care provider which exercises are safe for you. Do exercises exactly as told by your health care provider and adjust them as directed. It is normal to feel mild stretching, pulling, tightness, or discomfort as you do these exercises. Stop right away if you feel sudden pain or your pain gets worse. Do not begin these exercises until told by your health care provider. Neck exercises can be important for many reasons. They can improve strength and maintain flexibility in your neck, which will help your upper back and prevent neck pain. Stretching exercises Rotation neck stretching  Sit in a chair or stand up. Place your feet flat on the floor, shoulder-width apart. Slowly turn your head (rotate) to the right until a slight stretch is felt. Turn it all the way to the right so you can look over your right shoulder. Do not tilt or tip your head. Hold this position for 10-30 seconds. Slowly turn your head (rotate) to the left until a slight stretch is felt. Turn it all the way to the left so you can look over your left shoulder. Do not tilt or tip your head. Hold this position for 10-30 seconds. Repeat __________ times. Complete this exercise __________ times a day. Neck retraction  Sit in a sturdy chair or stand up. Look straight ahead. Do not bend your neck. Use your fingers to push your chin backward (retraction). Do not bend your neck for this movement. Continue to face straight ahead. If you are doing the exercise properly, you will feel a slight sensation in your throat and a stretch at the back of your neck. Hold the stretch for 1-2 seconds. Repeat __________ times. Complete this exercise __________ times a day. Strengthening exercises Neck press  Lie on your back on a firm bed or on the floor with a pillow under your head. Use your neck muscles to push your head down on the pillow and straighten your spine. Hold the position as well as you can. Keep your head  facing up (in a neutral position) and your chin tucked. Slowly count to 5 while holding this position. Repeat __________ times. Complete this exercise __________ times a day. Isometrics These are exercises in which you strengthen the muscles in your neck while keeping your neck still (isometrics). Sit in a supportive chair and place your hand on your forehead. Keep your head and face facing straight ahead. Do not flex or extend your neck while doing isometrics. Push forward with your head and neck while pushing back with your hand. Hold for 10 seconds. Do the sequence again, this time putting your hand against the back of your head. Use your head and neck to push backward against the hand pressure. Finally, do the same exercise on either side of your head, pushing sideways against the pressure of your hand. Repeat __________ times. Complete this exercise __________ times a day. Prone head lifts  Lie face-down (prone position), resting on your elbows so that your chest and upper back are raised. Start with your head facing downward, near your chest. Position your chin either on or near your chest. Slowly lift your head upward. Lift until you are looking straight ahead. Then continue lifting your head as far back as you can comfortably stretch. Hold your head up for 5 seconds. Then slowly lower it to your starting position. Repeat __________ times. Complete this exercise __________ times a day. Supine head lifts  Lie on your back (supine position), bending your knees   to point to the ceiling and keeping your feet flat on the floor. Lift your head slowly off the floor, raising your chin toward your chest. Hold for 5 seconds. Repeat __________ times. Complete this exercise __________ times a day. Scapular retraction  Stand with your arms at your sides. Look straight ahead. Slowly pull both shoulders (scapulae) backward and downward (retraction) until you feel a stretch between your shoulder  blades in your upper back. Hold for 10-30 seconds. Relax and repeat. Repeat __________ times. Complete this exercise __________ times a day. Contact a health care provider if: Your neck pain or discomfort gets worse when you do an exercise. Your neck pain or discomfort does not improve within 2 hours after you exercise. If you have any of these problems, stop exercising right away. Do not do the exercises again unless your health care provider says that you can. Get help right away if: You develop sudden, severe neck pain. If this happens, stop exercising right away. Do not do the exercises again unless your health care provider says that you can. This information is not intended to replace advice given to you by your health care provider. Make sure you discuss any questions you have with your health care provider. Document Revised: 03/23/2021 Document Reviewed: 03/23/2021 Elsevier Patient Education  2022 [employer]  

## 2021-11-03 LAB — CBC WITH DIFFERENTIAL/PLATELET
Absolute Monocytes: 598 cells/uL (ref 200–950)
Basophils Absolute: 42 cells/uL (ref 0–200)
Basophils Relative: 0.5 %
Eosinophils Absolute: 100 cells/uL (ref 15–500)
Eosinophils Relative: 1.2 %
HCT: 50.1 % — ABNORMAL HIGH (ref 35.0–45.0)
Hemoglobin: 16.5 g/dL — ABNORMAL HIGH (ref 11.7–15.5)
Lymphs Abs: 1901 cells/uL (ref 850–3900)
MCH: 29.5 pg (ref 27.0–33.0)
MCHC: 32.9 g/dL (ref 32.0–36.0)
MCV: 89.6 fL (ref 80.0–100.0)
MPV: 10.4 fL (ref 7.5–12.5)
Monocytes Relative: 7.2 %
Neutro Abs: 5661 cells/uL (ref 1500–7800)
Neutrophils Relative %: 68.2 %
Platelets: 298 10*3/uL (ref 140–400)
RBC: 5.59 10*6/uL — ABNORMAL HIGH (ref 3.80–5.10)
RDW: 14.1 % (ref 11.0–15.0)
Total Lymphocyte: 22.9 %
WBC: 8.3 10*3/uL (ref 3.8–10.8)

## 2021-11-03 LAB — URINALYSIS, ROUTINE W REFLEX MICROSCOPIC
Bacteria, UA: NONE SEEN /HPF
Bilirubin Urine: NEGATIVE
Glucose, UA: NEGATIVE
Hgb urine dipstick: NEGATIVE
Hyaline Cast: NONE SEEN /LPF
Ketones, ur: NEGATIVE
Nitrite: NEGATIVE
Protein, ur: NEGATIVE
RBC / HPF: NONE SEEN /HPF (ref 0–2)
Specific Gravity, Urine: 1.013 (ref 1.001–1.035)
pH: 6.5 (ref 5.0–8.0)

## 2021-11-03 LAB — COMPLETE METABOLIC PANEL WITH GFR
AG Ratio: 1.9 (calc) (ref 1.0–2.5)
ALT: 16 U/L (ref 6–29)
AST: 15 U/L (ref 10–35)
Albumin: 4.7 g/dL (ref 3.6–5.1)
Alkaline phosphatase (APISO): 53 U/L (ref 37–153)
BUN: 15 mg/dL (ref 7–25)
CO2: 33 mmol/L — ABNORMAL HIGH (ref 20–32)
Calcium: 10.4 mg/dL (ref 8.6–10.4)
Chloride: 101 mmol/L (ref 98–110)
Creat: 0.82 mg/dL (ref 0.50–1.03)
Globulin: 2.5 g/dL (calc) (ref 1.9–3.7)
Glucose, Bld: 118 mg/dL — ABNORMAL HIGH (ref 65–99)
Potassium: 4.5 mmol/L (ref 3.5–5.3)
Sodium: 140 mmol/L (ref 135–146)
Total Bilirubin: 0.6 mg/dL (ref 0.2–1.2)
Total Protein: 7.2 g/dL (ref 6.1–8.1)
eGFR: 87 mL/min/{1.73_m2} (ref >=60)

## 2021-11-03 LAB — LIPID PANEL
Cholesterol: 223 mg/dL — ABNORMAL HIGH (ref <200)
HDL: 65 mg/dL (ref >=50)
LDL Cholesterol (Calc): 133 mg/dL (calc) — ABNORMAL HIGH
Non-HDL Cholesterol (Calc): 158 mg/dL (calc) — ABNORMAL HIGH (ref <130)
Total CHOL/HDL Ratio: 3.4 (calc) (ref <5.0)
Triglycerides: 142 mg/dL (ref <150)

## 2021-11-03 LAB — VITAMIN D 25 HYDROXY (VIT D DEFICIENCY, FRACTURES): Vit D, 25-Hydroxy: 39 ng/mL (ref 30–100)

## 2021-11-03 NOTE — Progress Notes (Signed)
Vitamin D is WNL.  Continue on maintenance dose of vitamin D.   Total cholesterol is elevated-223. LDL is elevated-133.   UA revealed 1+ leukocytes.  Negative for nitrites and bacteria.   CBC stable.  glucose is 118.  Rest of CMP WNL.

## 2021-11-15 ENCOUNTER — Encounter: Payer: Self-pay | Admitting: Obstetrics and Gynecology

## 2021-11-15 ENCOUNTER — Ambulatory Visit (INDEPENDENT_AMBULATORY_CARE_PROVIDER_SITE_OTHER): Payer: 59 | Admitting: Obstetrics and Gynecology

## 2021-11-15 ENCOUNTER — Other Ambulatory Visit: Payer: Self-pay

## 2021-11-15 VITALS — BP 120/60 | Ht 62.5 in | Wt 176.0 lb

## 2021-11-15 DIAGNOSIS — Z113 Encounter for screening for infections with a predominantly sexual mode of transmission: Secondary | ICD-10-CM | POA: Diagnosis not present

## 2021-11-15 DIAGNOSIS — R399 Unspecified symptoms and signs involving the genitourinary system: Secondary | ICD-10-CM | POA: Diagnosis not present

## 2021-11-15 DIAGNOSIS — N898 Other specified noninflammatory disorders of vagina: Secondary | ICD-10-CM

## 2021-11-15 LAB — POCT WET PREP WITH KOH
Clue Cells Wet Prep HPF POC: NEGATIVE
KOH Prep POC: NEGATIVE
Trichomonas, UA: NEGATIVE
Yeast Wet Prep HPF POC: NEGATIVE

## 2021-11-15 LAB — POCT URINALYSIS DIPSTICK
Bilirubin, UA: NEGATIVE
Blood, UA: NEGATIVE
Glucose, UA: NEGATIVE
Ketones, UA: NEGATIVE
Leukocytes, UA: NEGATIVE
Nitrite, UA: POSITIVE
Protein, UA: NEGATIVE
Spec Grav, UA: 1.025 (ref 1.010–1.025)
pH, UA: 6 (ref 5.0–8.0)

## 2021-11-15 MED ORDER — NITROFURANTOIN MONOHYD MACRO 100 MG PO CAPS: 100.0000 mg | ORAL_CAPSULE | Freq: Two times a day (BID) | ORAL | 0 refills | Status: AC

## 2021-11-15 NOTE — Progress Notes (Signed)
Patricia Peach, MD   Chief Complaint  Patient presents with   Urinary Tract Infection    Frequency urinating, no burning x 1 month   Vaginal Odor    Sour, no discharge or itchiness x couple of weeks   STD testing    HPI:      [name] is a 51 y.o. [id number] whose LMP was Patient's last menstrual period was 10/02/2014., presents today for NP eval of urinary frequency with and without good flow since 12/22. No dysuria, hematuria, LBP, fevers, pelvic pain, no caffeine. Has occas urge incont recently, unusual for pt. Went to ED 10/12/21 with n/v/d and was found to be dehydrated. Urine C&S showed multiple bacteria, pt treated with keflex (which wasn't listed in sensitivity report). Pt states sx improved some but has persisted. Had neg UA with provider 11/02/21, no C&S done. Pt with hx of nephrolithiasis and ureteroscopy a couple years ago. Was Found to have tiny  punctate nonobstructing stone in the lower right kidney on CT scan 1/23 in ED.  She is sex active, no pain/bleeding. LMP 2015, no vasomotor sx. Has noticed a vaginal odor for 1-2 months, no d/c/irritation. Pt would like STD testing.  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 Cystoscopy, left ureteroscopy and basket extraction of ureteral stone, retrograde pyelogram with intraoperative interpretation, left ureteral stent placement on Dangler ;  Surgeon: Billey Co, MD;  Location: [facility];  Service: Urology;  Laterality: Left;   FOOT SURGERY     REDUCTION MAMMAPLASTY  2005   TONSILLECTOMY     TUBAL LIGATION      Family History  Problem Relation Age of Onset   Hypercalcemia Mother    High Cholesterol Mother    Diabetes Father    Bipolar disorder Sister    Depression Sister    Colon cancer Maternal Grandmother    Breast cancer Neg Hx     Social History   Socioeconomic History   Marital status: Divorced    Spouse name: Not on file   Number of children: Not on file   Years of education: Not on file   Highest education level: Not on file  Occupational History   Not on file  Tobacco Use   Smoking status: Every Day    Packs/day: 0.50    Years: 21.00    Pack years: 10.50    Types: Cigarettes   Smokeless tobacco: Never  Vaping Use   Vaping Use: Never  used  Substance and Sexual Activity   Alcohol use: Yes    Comment: occassionally 2 glasses of wine a month   Drug use: No   Sexual activity: Not Currently    Birth control/protection: None, Post-menopausal  Other Topics Concern   Not on file  Social History Narrative   Not on file   Social Determinants of Health   Financial Resource Strain: Not on file  Food Insecurity: Not on file  Transportation Needs: Not on file  Physical Activity: Not on file  Stress: Not on file  Social Connections: Not on file  Intimate Partner Violence: Not on file    Outpatient  Medications Prior to Visit  Medication Sig Dispense Refill   hydroxychloroquine (PLAQUENIL) 200 MG tablet TAKE 1 TABLET TWICE A DAY 180 tablet 0   Multiple Vitamins-Minerals (ZINC PO) Take by mouth daily.     OTEZLA 30 MG TABS TAKE 1 TABLET TWICE A DAY 60 tablet 2   OVER THE COUNTER MEDICATION SEA MOSS     triamterene-hydrochlorothiazide (DYAZIDE) 37.5-25 MG capsule Take 1 capsule by mouth daily.     VITAMIN E PO Take by mouth daily.     Cholecalciferol (VITAMIN D-3) 5000 units TABS Take 5,000 mg by mouth daily. (Patient not taking: Reported on 11/02/2021)     itraconazole (SPORANOX) 100 MG capsule Take 1 capsule (100 mg total) by mouth daily. (Patient not taking: Reported on 11/02/2021) 90 capsule 0   ondansetron (ZOFRAN-ODT) 4 MG disintegrating tablet Take 1 tablet (4 mg total) by mouth every 8 (eight) hours as needed. 20 tablet 0   No facility-administered medications prior to visit.      ROS:  Review of Systems  Constitutional:  Negative for fever.  Gastrointestinal:  Negative for blood in stool, constipation, diarrhea, nausea and vomiting.  Genitourinary:  Positive for difficulty urinating and frequency. Negative for dyspareunia, dysuria, flank pain, hematuria, urgency, vaginal bleeding, vaginal discharge and vaginal pain.  Musculoskeletal:  Negative for back pain.  Skin:  Negative for rash.  BREAST: No symptoms   OBJECTIVE:   Vitals:  BP 120/60    Ht 5' 2.5" (1.588 m)    Wt 176 lb (79.8 kg)    LMP 10/02/2014 Comment: denies preg   BMI 31.68 kg/m   Physical Exam Vitals reviewed.  Constitutional:      Appearance: She is well-developed.  Pulmonary:     Effort: Pulmonary effort is normal.  Genitourinary:    General: Normal vulva.     Pubic Area: No rash.      Labia:        Right: No rash, tenderness or lesion.        Left: No rash, tenderness or lesion.      Vagina: Normal. No vaginal discharge, erythema or tenderness.     Cervix: Normal.     Uterus: Normal. Not  enlarged and not tender.      Adnexa: Right adnexa normal and left adnexa normal.       Right: No mass or tenderness.         Left: No mass or tenderness.    Musculoskeletal:        General: Normal range of motion.     Cervical back: Normal range of motion.  Skin:    General: Skin is warm and dry.  Neurological:     General: No focal deficit present.     Mental Status: She is alert and oriented to person, place, and time.  Psychiatric:  Mood and Affect: Mood normal.        Behavior: Behavior normal.        Thought Content: Thought content normal.        Judgment: Judgment normal.    Results: Results for orders placed or performed in visit on 11/15/21 (from the past 24 hour(s))  POCT Wet Prep with KOH     Status: Normal   Collection Time: 11/15/21  5:20 PM  Result Value Ref Range   Trichomonas, UA Negative    Clue Cells Wet Prep HPF POC neg    Epithelial Wet Prep HPF POC     Yeast Wet Prep HPF POC neg    Bacteria Wet Prep HPF POC     RBC Wet Prep HPF POC     WBC Wet Prep HPF POC     KOH Prep POC Negative Negative  POCT Urinalysis Dipstick     Status: Abnormal   Collection Time: 11/15/21  5:21 PM  Result Value Ref Range   Color, UA yellow    Clarity, UA clear    Glucose, UA Negative Negative   Bilirubin, UA neg    Ketones, UA neg    Spec Grav, UA 1.025 1.010 - 1.025   Blood, UA neg    pH, UA 6.0 5.0 - 8.0   Protein, UA Negative Negative   Urobilinogen, UA     Nitrite, UA pos    Leukocytes, UA Negative Negative   Appearance     Odor       Assessment/Plan: UTI symptoms - Plan: nitrofurantoin, macrocrystal-monohydrate, (MACROBID) 100 MG capsule, POCT Urinalysis Dipstick, Urine Culture; pos sx and UA. Hx of mult bacteria on C&S 1/23 in ED and put on keflex. Wonder if not fully treated. Rx macrobid eRxd (susceptible on 1/23 C&S). Check C&S. F/u prn.   Vaginal odor - Plan: POCT Wet Prep with KOH; neg wet prep. Rule out STDs. If neg, most likely due to  UTI.  Screening for STD (sexually transmitted disease) - Plan: Chlamydia/Gonococcus/Trichomonas, NAA    Meds ordered this encounter  Medications   nitrofurantoin, macrocrystal-monohydrate, (MACROBID) 100 MG capsule    Sig: Take 1 capsule (100 mg total) by mouth 2 (two) times daily for 5 days.    Dispense:  10 capsule    Refill:  0    Order Specific Question:   Supervising Provider    Answer:   Gae Dry [[id number]]      Return if symptoms worsen or fail to improve.  Gevork Ayyad B. Joann Kulpa, PA-C 11/15/2021 5:23 PM

## 2021-11-18 LAB — URINE CULTURE

## 2021-11-18 LAB — CHLAMYDIA/GONOCOCCUS/TRICHOMONAS, NAA
Chlamydia by NAA: NEGATIVE
Gonococcus by NAA: NEGATIVE
Trich vag by NAA: NEGATIVE

## 2021-11-18 NOTE — Progress Notes (Signed)
PCP: Sharyne Peach, MD   Chief Complaint  Patient presents with   Gynecologic Exam    HPI:      [name] is a 51 y.o. [phone] whose LMP was Patient's last menstrual period was 10/02/2014., presents today for her annual examination.  Her menses are absent due to menopause, no PMB. She does not have vasomotor sx.   Sex activity: single partner, contraception - post menopausal status. She does not have vaginal dryness.  Last Pap: not recent; no hx of abn paps.  Hx of STDs: trich in past; had neg vaginal STD testing 1/23; wants blood STD testing today  Last mammogram: 07/21/21  Results were: normal--routine follow-up in 12 months There is no FH of breast cancer. There is no FH of ovarian cancer. The patient does do self-breast exams.  Colonoscopy: 2022 at [facility] with polyp; Repeat due after ??? Years (pt not sure).   Tobacco use: 1/2 ppd, going to try to quit with chantix from PCP Alcohol use: none No drug use Exercise: moderately active  She does get adequate calcium and little Vitamin D in her diet, due to hx of kidney stones (per MD).  Labs with PCP.  Treated for UTI 11/15/21 with E. Coli on C&S, pt on correct abx. Sx improved. Would like urine rechecked today.   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 Cystoscopy, left ureteroscopy and basket extraction of ureteral stone, retrograde pyelogram with intraoperative interpretation, left ureteral stent placement on Dangler ;  Surgeon: Billey Co, MD;  Location: [facility];  Service: Urology;  Laterality: Left;   FOOT SURGERY     REDUCTION MAMMAPLASTY  2005   TONSILLECTOMY     TUBAL LIGATION      Family History  Problem Relation Age of Onset   Hypercalcemia Mother    High Cholesterol Mother    Diabetes Father    Bipolar disorder Sister    Depression Sister    Colon cancer Maternal Grandmother    Breast cancer Neg Hx     Social History   Socioeconomic History   Marital status: Divorced    Spouse name: Not on file   Number of children: Not on file   Years of education: Not on file   Highest education level: Not on file  Occupational History   Not on file  Tobacco Use   Smoking status: Every Day    Packs/day: 0.50    Years: 21.00    Pack years: 10.50    Types:  Cigarettes   Smokeless tobacco: Never  Vaping Use   Vaping Use: Never used  Substance and Sexual Activity   Alcohol use: Yes    Comment: occassionally 2 glasses of wine a month   Drug use: No   Sexual activity: Not Currently    Birth control/protection: None, Post-menopausal  Other Topics Concern   Not on file  Social History Narrative   Not on file   Social Determinants of Health   Financial Resource Strain: Not on file  Food Insecurity: Not on file  Transportation Needs: Not on file  Physical Activity: Not on file  Stress: Not on file  Social Connections: Not on file   Intimate Partner Violence: Not on file     Current Outpatient Medications:    hydroxychloroquine (PLAQUENIL) 200 MG tablet, TAKE 1 TABLET TWICE A DAY, Disp: 180 tablet, Rfl: 0   Multiple Vitamins-Minerals (ZINC PO), Take by mouth daily., Disp: , Rfl:    OTEZLA 30 MG TABS, TAKE 1 TABLET TWICE A DAY, Disp: 60 tablet, Rfl: 2   VITAMIN E PO, Take by mouth daily., Disp: , Rfl:    OVER THE COUNTER MEDICATION, SEA MOSS (Patient not taking: Reported on 11/22/2021), Disp: , Rfl:    triamterene-hydrochlorothiazide (DYAZIDE) 37.5-25 MG capsule, Take 1 capsule by mouth daily. (Patient not taking: Reported on 11/22/2021), Disp: , Rfl:      ROS:  Review of Systems  Constitutional:  Negative for fatigue, fever and unexpected weight change.  Respiratory:  Negative for cough, shortness of breath and wheezing.   Cardiovascular:  Negative for chest pain, palpitations and leg swelling.  Gastrointestinal:  Negative for blood in stool, constipation, diarrhea, nausea and vomiting.  Endocrine: Negative for cold intolerance, heat intolerance and polyuria.  Genitourinary:  Negative for dyspareunia, dysuria, flank pain, frequency, genital sores, hematuria, menstrual problem, pelvic pain, urgency, vaginal bleeding, vaginal discharge and vaginal pain.  Musculoskeletal:  Negative for back pain, joint swelling and myalgias.  Skin:  Negative for rash.  Neurological:  Negative for dizziness, syncope, light-headedness, numbness and headaches.  Hematological:  Negative for adenopathy.  Psychiatric/Behavioral:  Negative for agitation, confusion, sleep disturbance and suicidal ideas. The patient is not nervous/anxious.   BREAST: No symptoms    Objective: BP 122/80    Ht 5\' 2"  (1.575 m)    Wt 179 lb (81.2 kg)    LMP 10/02/2014 Comment: denies preg   BMI 32.74 kg/m    Physical Exam Constitutional:      Appearance: She is well-developed.  Genitourinary:     Vulva normal.     Right Labia: No rash, tenderness or  lesions.    Left Labia: No tenderness, lesions or rash.    No vaginal discharge, erythema or tenderness.      Right Adnexa: not tender and no mass present.    Left Adnexa: not tender and no mass present.    No cervical friability or polyp.     Uterus is not enlarged or tender.  Breasts:    Right: No mass, nipple discharge, skin change or tenderness.     Left: No mass, nipple discharge, skin change or tenderness.  Neck:     Thyroid: No thyromegaly.  Cardiovascular:     Rate and Rhythm: Normal rate and regular rhythm.     Heart sounds: Normal heart sounds. No murmur heard. Pulmonary:     Effort: Pulmonary effort is normal.     Breath sounds: Normal breath sounds.  Abdominal:     Palpations:  Abdomen is soft.     Tenderness: There is no abdominal tenderness. There is no guarding or rebound.  Musculoskeletal:        General: Normal range of motion.     Cervical back: Normal range of motion.  Lymphadenopathy:     Cervical: No cervical adenopathy.  Neurological:     General: No focal deficit present.     Mental Status: She is alert and oriented to person, place, and time.     Cranial Nerves: No cranial nerve deficit.  Skin:    General: Skin is warm and dry.  Psychiatric:        Mood and Affect: Mood normal.        Behavior: Behavior normal.        Thought Content: Thought content normal.        Judgment: Judgment normal.  Vitals reviewed.    Results: Results for orders placed or performed in visit on 11/22/21 (from the past 24 hour(s))  POCT Urinalysis Dipstick     Status: Normal   Collection Time: 11/22/21 11:25 AM  Result Value Ref Range   Color, UA yellow    Clarity, UA clear    Glucose, UA Negative Negative   Bilirubin, UA neg    Ketones, UA neg    Spec Grav, UA 1.025 1.010 - 1.025   Blood, UA neg    pH, UA 5.0 5.0 - 8.0   Protein, UA Negative Negative   Urobilinogen, UA     Nitrite, UA neg    Leukocytes, UA Negative Negative   Appearance     Odor       Assessment/Plan:  Encounter for annual routine gynecological examination  Cervical cancer screening - Plan: Cytology - PAP  Screening for STD (sexually transmitted disease) - Plan: HIV Antibody (routine testing w rflx), RPR, Hepatitis C antibody  Screening for HPV (human papillomavirus) - Plan: Cytology - PAP  Encounter for screening mammogram for malignant neoplasm of breast - Plan: MM 3D SCREEN BREAST BILATERAL; pt has mammo sched  History of UTI - Plan: POCT Urinalysis Dipstick; neg UA. Reassurance. F/u prn.           GYN counsel breast self exam, mammography screening, menopause, adequate intake of calcium and vitamin D, diet and exercise    F/U  Return in about 1 year (around 11/22/2022).  Patricia Ray B. Mackson Botz, PA-C 11/22/2021 11:26 AM

## 2021-11-22 ENCOUNTER — Encounter: Payer: Self-pay | Admitting: Obstetrics and Gynecology

## 2021-11-22 ENCOUNTER — Other Ambulatory Visit (HOSPITAL_COMMUNITY)
Admission: RE | Admit: 2021-11-22 | Discharge: 2021-11-22 | Disposition: A | Discharge status: Home / Self Care | Payer: 59 | Source: Ambulatory Visit | Attending: Obstetrics and Gynecology | Admitting: Obstetrics and Gynecology

## 2021-11-22 ENCOUNTER — Ambulatory Visit (INDEPENDENT_AMBULATORY_CARE_PROVIDER_SITE_OTHER): Payer: 59 | Admitting: Obstetrics and Gynecology

## 2021-11-22 ENCOUNTER — Other Ambulatory Visit: Payer: Self-pay

## 2021-11-22 VITALS — BP 122/80 | Ht 62.0 in | Wt 179.0 lb

## 2021-11-22 DIAGNOSIS — Z87440 Personal history of urinary (tract) infections: Secondary | ICD-10-CM

## 2021-11-22 DIAGNOSIS — Z1151 Encounter for screening for human papillomavirus (HPV): Secondary | ICD-10-CM | POA: Insufficient documentation

## 2021-11-22 DIAGNOSIS — Z124 Encounter for screening for malignant neoplasm of cervix: Secondary | ICD-10-CM | POA: Diagnosis present

## 2021-11-22 DIAGNOSIS — Z1231 Encounter for screening mammogram for malignant neoplasm of breast: Secondary | ICD-10-CM

## 2021-11-22 DIAGNOSIS — Z01419 Encounter for gynecological examination (general) (routine) without abnormal findings: Secondary | ICD-10-CM | POA: Diagnosis not present

## 2021-11-22 DIAGNOSIS — Z1211 Encounter for screening for malignant neoplasm of colon: Secondary | ICD-10-CM

## 2021-11-22 DIAGNOSIS — Z113 Encounter for screening for infections with a predominantly sexual mode of transmission: Secondary | ICD-10-CM

## 2021-11-22 LAB — POCT URINALYSIS DIPSTICK
Bilirubin, UA: NEGATIVE
Blood, UA: NEGATIVE
Glucose, UA: NEGATIVE
Ketones, UA: NEGATIVE
Leukocytes, UA: NEGATIVE
Nitrite, UA: NEGATIVE
Protein, UA: NEGATIVE
Spec Grav, UA: 1.025 (ref 1.010–1.025)
pH, UA: 5 (ref 5.0–8.0)

## 2021-11-22 NOTE — Patient Instructions (Signed)
I value your feedback and you entrusting us with your care. If you get a [facility] patient survey, I would appreciate you taking the time to let us know about your experience today. Thank you!  [facility]: [phone]      

## 2021-11-23 LAB — HEPATITIS C ANTIBODY: Hep C Virus Ab: NONREACTIVE

## 2021-11-23 LAB — CYTOLOGY - PAP
Adequacy: ABSENT
Comment: NEGATIVE
Diagnosis: NEGATIVE
High risk HPV: NEGATIVE

## 2021-11-23 LAB — HIV ANTIBODY (ROUTINE TESTING W REFLEX): HIV Screen 4th Generation wRfx: NONREACTIVE

## 2021-11-23 LAB — RPR: RPR Ser Ql: NONREACTIVE

## 2021-11-30 ENCOUNTER — Other Ambulatory Visit (HOSPITAL_COMMUNITY): Payer: Self-pay

## 2021-11-30 ENCOUNTER — Telehealth: Payer: Self-pay

## 2021-11-30 NOTE — Telephone Encounter (Signed)
Received notification from [place] regarding a prior authorization for [place]. Authorization has been APPROVED from 10/31/2021 to 11/30/2022.    Authorization # [id number]

## 2021-11-30 NOTE — Telephone Encounter (Signed)
PA renewal initiated automatically by CoverMyMeds.  Submitted a Prior Authorization request to [employer] for [place] via CoverMyMeds. Will update once we receive a response.   Key: [id number]

## 2021-12-14 ENCOUNTER — Other Ambulatory Visit: Payer: Self-pay

## 2021-12-14 MED ORDER — HYDROXYCHLOROQUINE SULFATE 200 MG PO TABS: 200.0000 mg | ORAL_TABLET | Freq: Two times a day (BID) | ORAL | 0 refills | Status: DC

## 2021-12-14 NOTE — Telephone Encounter (Signed)
Next Visit: 05/03/2022 ? ?Last Visit: 11/02/2021 ? ?Labs: 11/02/2021 CBC stable.  glucose is 118.  Rest of CMP WNL.  ? ?Eye exam: 07/08/2021 normal  ? ?Current Dose per office note 11/02/2021: Plaquenil 200 mg 1 tablet by mouth twice daily  ? ?[id number] arthropathy  ? ?Last Fill: 10/18/2021 ? ?Okay to refill Plaquenil?  ?

## 2021-12-14 NOTE — Telephone Encounter (Signed)
Patient called requesting prescription refill of Hydroxychloroquine to be sent to [facility].   ?

## 2022-01-24 ENCOUNTER — Other Ambulatory Visit: Payer: Self-pay | Admitting: Physician Assistant

## 2022-01-24 DIAGNOSIS — L409 Psoriasis, unspecified: Secondary | ICD-10-CM

## 2022-01-24 NOTE — Telephone Encounter (Signed)
Next Visit: 03/01/2022 ?  ?Last Visit: 11/02/2021 ?  ?Labs: 11/02/2021 CBC stable.  glucose is 118.  Rest of CMP WNL.  ? ?Current Dose per office note 11/02/2021: Otezla 30 mg 1 tablet by mouth BID ? ?Dx: Psoriatic arthropathy  ? ?Okay to refill [place] [facility]? ?

## 2022-02-15 NOTE — Progress Notes (Signed)
Office Visit Note  Patient: Patricia Ray             Date of Birth: Jun 08, 1971           MRN: [id number]             PCP: Sharyne Peach, MD Referring: Sharyne Peach, MD Visit Date: 03/01/2022 Occupation: '@GUAROCC'$ @  Subjective:  Medication management  History of Present Illness: Patricia Ray is a 51 y.o. female with history of psoriatic arthritis, psoriasis and discoid lupus.  She states she has been tolerating Otezla 30 mg twice daily without any problems.  She has been also on Plaquenil 200 mg p.o. twice daily.  She has not had any recent rash.  She does get intermittent psoriasis in her scalp.  She also had some redness on her nose recently.  She is moving to [facility] permanently in June.  She would like to take her prescription refills to [facility].  She continues to have some discomfort in her knee joints and stiffness in her neck.  Activities of Daily Living:  Patient reports morning stiffness for 1 hour.   Patient Denies nocturnal pain.  Difficulty dressing/grooming: Denies Difficulty climbing stairs: Denies Difficulty getting out of chair: Denies Difficulty using hands for taps, buttons, cutlery, and/or writing: Reports  Review of Systems  Constitutional:  Positive for fatigue.  HENT:  Negative for mouth dryness.   Eyes:  Positive for dryness.  Respiratory:  Negative for shortness of breath.   Cardiovascular:  Positive for swelling in legs/feet.  Gastrointestinal:  Positive for diarrhea.  Endocrine: Positive for heat intolerance.  Genitourinary:  Negative for difficulty urinating.  Musculoskeletal:  Positive for joint pain, joint pain and morning stiffness. Negative for joint swelling.  Skin:  Positive for rash. Negative for color change and sensitivity to sunlight.  Allergic/Immunologic: Negative for susceptible to infections.  Neurological:  Negative for numbness.  Hematological:  Positive for bruising/bleeding tendency. Negative for  swollen glands.  Psychiatric/Behavioral:  Positive for sleep disturbance.    PMFS History:  Patient Active Problem List   Diagnosis Date Noted   OSA (obstructive sleep apnea) 10/30/2018   Primary osteoarthritis of both knees 08/23/2018   Psoriatic arthritis ([place]) 05/10/2018   Psoriasis of scalp 05/10/2018   Discoid lupus 05/10/2018   High risk medication use 05/10/2018   Essential hypertension 05/10/2018   Other insomnia 05/10/2018   Elevated prolactin level 05/10/2018   Vitamin D insufficiency 05/10/2018   Lupus erythematosus 04/11/2018   Inflammatory polyarthropathy ([place]) 04/11/2018   Renal stone 02/21/2016   Notalgia 02/07/2016   Increased prolactin level 03/19/2015    Past Medical History:  Diagnosis Date   Arthritis    psoriatic arthritis   Endometriosis    GERD (gastroesophageal reflux disease)    History of kidney stones    Hypertension    Inflammatory polyarthropathy (HCC)    Insomnia    Notalgia    Psoriasis    scalp   Renal stone    Sleep apnea    CPAP    Systemic lupus erythematosus ([place])    Vitamin D insufficiency     Family History  Problem Relation Age of Onset   Hypercalcemia Mother    High Cholesterol Mother    Diabetes Father    Bipolar disorder Sister    Depression Sister    Colon cancer Maternal Grandmother    Breast cancer Neg Hx    Past Surgical History:  Procedure Laterality Date   BREAST BIOPSY Right  04/04/2018   BENIGN EPIDERMAL INCLUSION CYST    BUNIONECTOMY     CESAREAN SECTION     COLONOSCOPY WITH PROPOFOL N/A 11/30/2020   Procedure: COLONOSCOPY WITH PROPOFOL;  Surgeon: Toledo, Benay Pike, MD;  Location: [facility] ENDOSCOPY;  Service: Gastroenterology;  Laterality: N/A;   CYSTOSCOPY/URETEROSCOPY/HOLMIUM LASER/STENT PLACEMENT Left 07/19/2019   Procedure: CYSTOSCOPY/URETEROSCOPY/HOLMIUM LASER/STENT PLACEMENT, 1. Cystoscopy, left ureteroscopy and basket extraction of ureteral stone, retrograde pyelogram with intraoperative interpretation,  left ureteral stent placement on [place] ;  Surgeon: Billey Co, MD;  Location: [facility] ORS;  Service: Urology;  Laterality: Left;   FOOT SURGERY     REDUCTION MAMMAPLASTY  2005   TONSILLECTOMY     TUBAL LIGATION     Social History   Social History Narrative   Not on file   Immunization History  Administered Date(s) Administered   Influenza,inj,Quad PF,6+ Mos 09/16/2016, 08/07/2019, 07/01/2020   Influenza,inj,quad, With Preservative 09/26/2016   Influenza-Unspecified 09/16/2016, 07/12/2017, 07/12/2018   PFIZER(Purple Top)SARS-COV-2 Vaccination 01/05/2020, 01/29/2020, 06/11/2020   Pneumococcal Polysaccharide-23 11/04/2016, 10/25/2018   Tdap 11/04/2016     Objective: Vital Signs: BP (!) 147/88 (BP Location: Left Arm, Patient Position: Sitting, Cuff Size: Small)   Pulse 96   Resp 12   Ht 5' 2.5" (1.588 m)   Wt 178 lb (80.7 kg)   LMP 10/02/2014 Comment: denies preg  BMI 32.04 kg/m    Physical Exam Vitals and nursing note reviewed.  Constitutional:      Appearance: She is well-developed.  HENT:     Head: Normocephalic and atraumatic.  Eyes:     Conjunctiva/sclera: Conjunctivae normal.  Cardiovascular:     Rate and Rhythm: Normal rate and regular rhythm.     Heart sounds: Normal heart sounds.  Pulmonary:     Effort: Pulmonary effort is normal.     Breath sounds: Normal breath sounds.  Abdominal:     General: Bowel sounds are normal.     Palpations: Abdomen is soft.  Musculoskeletal:     Cervical back: Normal range of motion.  Lymphadenopathy:     Cervical: No cervical adenopathy.  Skin:    General: Skin is warm and dry.     Capillary Refill: Capillary refill takes less than 2 seconds.  Neurological:     Mental Status: She is alert and oriented to person, place, and time.  Psychiatric:        Behavior: Behavior normal.     Musculoskeletal Exam: C-spine thoracic and lumbar spine were in good range of motion.  Shoulder joints, elbow joints, wrist joints, MCPs  PIPs and DIPs with good range of motion with no synovitis.  Hip joints, knee joints, ankles, MTPs and PIPs with good range of motion with no synovitis.  CDAI Exam: CDAI Score: -- Patient Global: --; Provider Global: -- Swollen: --; Tender: -- Joint Exam 03/01/2022   No joint exam has been documented for this visit   There is currently no information documented on the homunculus. Go to the Rheumatology activity and complete the homunculus joint exam.  Investigation: No additional findings.  Imaging: No results found.  Recent Labs: Lab Results  Component Value Date   WBC 8.3 11/02/2021   HGB 16.5 (H) 11/02/2021   PLT 298 11/02/2021   NA 140 11/02/2021   K 4.5 11/02/2021   CL 101 11/02/2021   CO2 33 (H) 11/02/2021   GLUCOSE 118 (H) 11/02/2021   BUN 15 11/02/2021   CREATININE 0.82 11/02/2021   BILITOT 0.6 11/02/2021   ALKPHOS  57 10/11/2021   AST 15 11/02/2021   ALT 16 11/02/2021   PROT 7.2 11/02/2021   ALBUMIN 4.7 10/11/2021   CALCIUM 10.4 11/02/2021   GFRAA 116 12/23/2020   QFTBGOLDPLUS NEGATIVE 05/10/2018    Speciality Comments: PLQ eye exam: 07/08/2021 normal. [facility]. Follow up annually. Prior therapy: MTX (allergy-hospitalized for 1 wk after taking MTX and simvastatin).  Procedures:  No procedures performed Allergies: Simvastatin, Methotrexate, and Nickel   Assessment / Plan:     Visit Diagnoses: Psoriatic arthropathy (HCC)-patient had no synovitis on examination.  She denies any joint inflammation.  She has been doing well on Otezla 30 mg p.o. twice daily.  Psoriasis-she gets intermittent rash in her scalp.  She had no active lesions today.  Patient would like to have a topical steroid cream for her face which she has prescription at home.  She will call us back with the prescription to get the refill.  High risk medication use - Plaquenil 200 mg 1 tablet by mouth twice daily.  Patient's height is 5 feet 2-1/2 inches.  She has not had any discoid lesions  in a while.  I advised her to reduce the dose of hydroxychloroquine to 200 mg p.o. twice daily Monday to Friday.  We will send a new prescription.  She is on Otezla 30 mg 1 tablet by mouth BID. PLQ eye exam: 07/08/2021.  Patient states she had a recent eye examination couple of weeks ago which was normal.  Discoid lupus -she had no active discoid lesions on my examination today.  She is moving to [place] in 1 month.  She would like to have autoimmune labs done prior to her move.  Patient states that she will lose her insurance.  Per her request prescription refill for hydroxychloroquine was sent for 90-day supply.  Plan: ANA, Anti-DNA antibody, double-stranded, C3 and C4, Sedimentation rate, Urinalysis, Routine w reflex microscopic.  Use of sunscreen was emphasized.  Primary osteoarthritis of both knees-she continues to have some discomfort in her knee joints.  She had some intentional weight loss which was helpful.  Neck stiffness-neck exercises were demonstrated in the office.  Other fatigue -we will check labs today.  Plan: CBC with Differential/Platelet, COMPLETE METABOLIC PANEL WITH GFR, TSH  Essential hypertension-blood pressure was elevated today.  Advised to monitor blood pressure closely.  Other medical problems are listed as follows:  Renal stone  Vitamin D insufficiency-vitamin D checked in January 2023 was normal.  She has been taking vitamin D supplement.  Other insomnia  Anxiety  OSA (obstructive sleep apnea)  Orders: Orders Placed This Encounter  Procedures   CBC with Differential/Platelet   COMPLETE METABOLIC PANEL WITH GFR   ANA   Anti-DNA antibody, double-stranded   C3 and C4   Sedimentation rate   Urinalysis, Routine w reflex microscopic   TSH   Meds ordered this encounter  Medications   hydroxychloroquine (PLAQUENIL) 200 MG tablet    Sig: Take 1 tablet 200 mg BID Monday-Friday    Dispense:  120 tablet    Refill:  0    Dose change 03/01/2022    Follow-Up  Instructions: Return if symptoms worsen or fail to improve, for Psoriatic arthritis.  Patient is moving to [place], [place] in June.   Bo Merino, MD  Note - This record has been created using Editor, commissioning.  Chart creation errors have been sought, but may not always  have been located. Such creation errors do not reflect on  the standard of  medical care.

## 2022-03-01 ENCOUNTER — Encounter: Payer: Self-pay | Admitting: Rheumatology

## 2022-03-01 ENCOUNTER — Ambulatory Visit (INDEPENDENT_AMBULATORY_CARE_PROVIDER_SITE_OTHER): Payer: 59 | Admitting: Rheumatology

## 2022-03-01 VITALS — BP 147/88 | HR 96 | Resp 12 | Ht 62.5 in | Wt 178.0 lb

## 2022-03-01 DIAGNOSIS — T148XXA Other injury of unspecified body region, initial encounter: Secondary | ICD-10-CM

## 2022-03-01 DIAGNOSIS — F419 Anxiety disorder, unspecified: Secondary | ICD-10-CM

## 2022-03-01 DIAGNOSIS — L930 Discoid lupus erythematosus: Secondary | ICD-10-CM

## 2022-03-01 DIAGNOSIS — N200 Calculus of kidney: Secondary | ICD-10-CM

## 2022-03-01 DIAGNOSIS — L409 Psoriasis, unspecified: Secondary | ICD-10-CM

## 2022-03-01 DIAGNOSIS — E559 Vitamin D deficiency, unspecified: Secondary | ICD-10-CM

## 2022-03-01 DIAGNOSIS — L4050 Arthropathic psoriasis, unspecified: Secondary | ICD-10-CM

## 2022-03-01 DIAGNOSIS — R202 Paresthesia of skin: Secondary | ICD-10-CM

## 2022-03-01 DIAGNOSIS — R5383 Other fatigue: Secondary | ICD-10-CM

## 2022-03-01 DIAGNOSIS — M170 Bilateral primary osteoarthritis of knee: Secondary | ICD-10-CM

## 2022-03-01 DIAGNOSIS — G4733 Obstructive sleep apnea (adult) (pediatric): Secondary | ICD-10-CM

## 2022-03-01 DIAGNOSIS — R3915 Urgency of urination: Secondary | ICD-10-CM

## 2022-03-01 DIAGNOSIS — M436 Torticollis: Secondary | ICD-10-CM

## 2022-03-01 DIAGNOSIS — Z79899 Other long term (current) drug therapy: Secondary | ICD-10-CM | POA: Diagnosis not present

## 2022-03-01 DIAGNOSIS — G4709 Other insomnia: Secondary | ICD-10-CM

## 2022-03-01 DIAGNOSIS — I10 Essential (primary) hypertension: Secondary | ICD-10-CM

## 2022-03-01 MED ORDER — HYDROXYCHLOROQUINE SULFATE 200 MG PO TABS: ORAL_TABLET | ORAL | 0 refills | Status: DC

## 2022-03-01 NOTE — Patient Instructions (Signed)
Standing Labs We placed an order today for your standing lab work.   Please have your standing labs drawn in October     If possible, please have your labs drawn 2 weeks prior to your appointment so that the provider can discuss your results at your appointment.  Please note that you may see your imaging and lab results in MyChart before we have reviewed them. We may be awaiting multiple results to interpret others before contacting you. Please allow our office up to 72 hours to thoroughly review all of the results before contacting the office for clarification of your results.  We have open lab daily: Monday through Thursday from 1:30-4:30 PM and Friday from 1:30-4:00 PM at the office of Dr. Waneta Fitting, [facility].   Please be advised, all patients with office appointments requiring lab work will take precedent over walk-in lab work.  If possible, please come for your lab work on Monday and Friday afternoons, as you may experience shorter wait times. The office is located at [address], [address], [place], [place] [address] No appointment is necessary.   Labs are drawn by [facility]. Please bring your co-pay at the time of your lab draw.  You may receive a bill from [facility] for your lab work.  Please note if you are on Hydroxychloroquine and and an order has been placed for a Hydroxychloroquine level, you will need to have it drawn 4 hours or more after your last dose.  If you wish to have your labs drawn at another location, please call the office 24 hours in advance to send orders.  If you have any questions regarding directions or hours of operation,  please call [phone].   As a reminder, please drink plenty of water prior to coming for your lab work. Thanks!  

## 2022-03-02 MED ORDER — TRIAMCINOLONE ACETONIDE 0.1 % EX OINT: 1.0000 "application " | TOPICAL_OINTMENT | Freq: Two times a day (BID) | CUTANEOUS | 0 refills | Status: DC

## 2022-03-02 MED ORDER — CLOBETASOL PROP EMOLLIENT BASE 0.05 % EX CREA: TOPICAL_CREAM | CUTANEOUS | 0 refills | Status: DC

## 2022-03-03 LAB — COMPLETE METABOLIC PANEL WITH GFR
AG Ratio: 1.8 (calc) (ref 1.0–2.5)
ALT: 13 U/L (ref 6–29)
AST: 14 U/L (ref 10–35)
Albumin: 4.5 g/dL (ref 3.6–5.1)
Alkaline phosphatase (APISO): 61 U/L (ref 37–153)
BUN: 12 mg/dL (ref 7–25)
CO2: 27 mmol/L (ref 20–32)
Calcium: 9.8 mg/dL (ref 8.6–10.4)
Chloride: 108 mmol/L (ref 98–110)
Creat: 0.64 mg/dL (ref 0.50–1.03)
Globulin: 2.5 g/dL (calc) (ref 1.9–3.7)
Glucose, Bld: 99 mg/dL (ref 65–99)
Potassium: 4.6 mmol/L (ref 3.5–5.3)
Sodium: 144 mmol/L (ref 135–146)
Total Bilirubin: 0.6 mg/dL (ref 0.2–1.2)
Total Protein: 7 g/dL (ref 6.1–8.1)
eGFR: 106 mL/min/{1.73_m2} (ref >=60)

## 2022-03-03 LAB — CBC WITH DIFFERENTIAL/PLATELET
Absolute Monocytes: 502 cells/uL (ref 200–950)
Basophils Absolute: 51 cells/uL (ref 0–200)
Basophils Relative: 0.6 %
Eosinophils Absolute: 43 cells/uL (ref 15–500)
Eosinophils Relative: 0.5 %
HCT: 48.6 % — ABNORMAL HIGH (ref 35.0–45.0)
Hemoglobin: 16.2 g/dL — ABNORMAL HIGH (ref 11.7–15.5)
Lymphs Abs: 2057 cells/uL (ref 850–3900)
MCH: 30.2 pg (ref 27.0–33.0)
MCHC: 33.3 g/dL (ref 32.0–36.0)
MCV: 90.5 fL (ref 80.0–100.0)
MPV: 10.1 fL (ref 7.5–12.5)
Monocytes Relative: 5.9 %
Neutro Abs: 5848 cells/uL (ref 1500–7800)
Neutrophils Relative %: 68.8 %
Platelets: 296 10*3/uL (ref 140–400)
RBC: 5.37 10*6/uL — ABNORMAL HIGH (ref 3.80–5.10)
RDW: 13.7 % (ref 11.0–15.0)
Total Lymphocyte: 24.2 %
WBC: 8.5 10*3/uL (ref 3.8–10.8)

## 2022-03-03 LAB — C3 AND C4
C3 Complement: 162 mg/dL (ref 83–193)
C4 Complement: 41 mg/dL (ref 15–57)

## 2022-03-03 LAB — ANTI-NUCLEAR AB-TITER (ANA TITER)
ANA TITER: 1:80 {titer} — ABNORMAL HIGH
ANA Titer 1: 1:160 {titer} — ABNORMAL HIGH

## 2022-03-03 LAB — URINALYSIS, ROUTINE W REFLEX MICROSCOPIC
Bilirubin Urine: NEGATIVE
Glucose, UA: NEGATIVE
Hgb urine dipstick: NEGATIVE
Ketones, ur: NEGATIVE
Leukocytes,Ua: NEGATIVE
Nitrite: NEGATIVE
Protein, ur: NEGATIVE
Specific Gravity, Urine: 1.011 (ref 1.001–1.035)
pH: 5.5 (ref 5.0–8.0)

## 2022-03-03 LAB — ANA: Anti Nuclear Antibody (ANA): POSITIVE — AB

## 2022-03-03 LAB — TSH: TSH: 1.46 mIU/L

## 2022-03-03 LAB — SEDIMENTATION RATE: Sed Rate: 14 mm/h (ref 0–30)

## 2022-03-03 LAB — ANTI-DNA ANTIBODY, DOUBLE-STRANDED: ds DNA Ab: 1 IU/mL

## 2022-03-03 NOTE — Progress Notes (Signed)
Hemoglobin is high and stable ANA is positive and stable CMP is normal, double-stranded DNA is negative, complements and sed rate are normal, UA is negative TSH is normal.

## 2022-03-10 ENCOUNTER — Other Ambulatory Visit: Payer: Self-pay | Admitting: Rheumatology

## 2022-03-10 MED ORDER — CLOBETASOL PROP EMOLLIENT BASE 0.05 % EX CREA
TOPICAL_CREAM | CUTANEOUS | 0 refills | Status: AC
Start: 2022-03-10 — End: ?

## 2022-03-10 MED ORDER — TRIAMCINOLONE ACETONIDE 0.1 % EX OINT: 1.0000 "application " | TOPICAL_OINTMENT | Freq: Two times a day (BID) | CUTANEOUS | 0 refills | Status: AC

## 2022-03-10 MED ORDER — HYDROXYCHLOROQUINE SULFATE 200 MG PO TABS: ORAL_TABLET | ORAL | 0 refills | Status: AC

## 2022-03-10 NOTE — Telephone Encounter (Signed)
Patient called the office stating her recent prescriptions for Kenalog ointment, Plaquenil, and Clobetasol cream were lost in the mail. Patient reached out to [facility] and was advised to call the office for new prescriptions for all 3 medications. Patient states she needs those sent in today as she only has 10 days of plaquenil left.

## 2022-03-16 ENCOUNTER — Telehealth: Payer: Self-pay | Admitting: Rheumatology

## 2022-03-16 NOTE — Telephone Encounter (Signed)
Returned call to [place]. Advised we sent a prescription in on 03/10/2022. She states they have not received it. Gave verbal.

## 2022-03-16 NOTE — Telephone Encounter (Signed)
Leonidas Romberg, a pharmacist at [facility], left a voicemail stating the patient needs a renewal of Hydroxychloroquine '200mg'$  to be sent to [facility]. Verbal # [phone]  Patient is low on medication.

## 2022-04-15 ENCOUNTER — Other Ambulatory Visit: Payer: Self-pay | Admitting: Physician Assistant

## 2022-04-15 DIAGNOSIS — L409 Psoriasis, unspecified: Secondary | ICD-10-CM

## 2022-04-15 NOTE — Telephone Encounter (Signed)
Next Visit: not on file. Patient moved to [place] in June  Last Visit: 03/01/2022  Last Fill: 01/24/2022  DX: Psoriatic arthropathy   Current Dose per office note 03/01/2022: Otezla 30 mg 1 tablet by mouth BID  Labs: 03/01/2022 Hemoglobin is high and stable ANA is positive and stable CMP is normal, double-stranded DNA is negative, complements and sed rate are normal, UA is negative TSH is normal.  Okay to refill [place]?

## 2022-04-21 ENCOUNTER — Telehealth: Payer: Self-pay | Admitting: Pharmacist

## 2022-04-21 NOTE — Telephone Encounter (Signed)
Received fax from [facility] (they fill her [place]) stating that patient has moved and has new insurance. She will be establishing care with a new provider and will have new rx sent thereafter. Dr. Estanislado Pandy is aware and has noted this in her chart note.  Knox Saliva, PharmD, MPH, BCPS, CPP Clinical Pharmacist (Rheumatology and Pulmonology)

## 2022-05-03 ENCOUNTER — Ambulatory Visit: Payer: 59 | Admitting: Rheumatology

## 2022-05-16 ENCOUNTER — Other Ambulatory Visit: Payer: Self-pay | Admitting: Rheumatology
